# Patient Record
Sex: Female | Born: 1965 | Race: White | Hispanic: No | Smoking: Former smoker
Health system: Southern US, Community
[De-identification: ages and names within clinical notes are randomized; demographics above are authoritative.]

## PROBLEM LIST (undated history)

## (undated) DIAGNOSIS — F32A Depression, unspecified: Secondary | ICD-10-CM

## (undated) DIAGNOSIS — I10 Essential (primary) hypertension: Secondary | ICD-10-CM

## (undated) DIAGNOSIS — F419 Anxiety disorder, unspecified: Secondary | ICD-10-CM

## (undated) DIAGNOSIS — F319 Bipolar disorder, unspecified: Secondary | ICD-10-CM

## (undated) DIAGNOSIS — F329 Major depressive disorder, single episode, unspecified: Secondary | ICD-10-CM

## (undated) HISTORY — PX: ABDOMINAL HYSTERECTOMY: SHX81

---

## 2008-10-17 ENCOUNTER — Emergency Department: Payer: Self-pay | Admitting: Emergency Medicine

## 2009-03-08 ENCOUNTER — Ambulatory Visit: Payer: Self-pay | Admitting: Nurse Practitioner

## 2010-05-23 ENCOUNTER — Ambulatory Visit: Payer: Self-pay | Admitting: Unknown Physician Specialty

## 2010-05-27 ENCOUNTER — Ambulatory Visit: Payer: Self-pay | Admitting: Unknown Physician Specialty

## 2010-05-30 LAB — PATHOLOGY REPORT

## 2012-03-06 DIAGNOSIS — E559 Vitamin D deficiency, unspecified: Secondary | ICD-10-CM | POA: Insufficient documentation

## 2012-03-06 DIAGNOSIS — E538 Deficiency of other specified B group vitamins: Secondary | ICD-10-CM | POA: Insufficient documentation

## 2012-06-18 ENCOUNTER — Ambulatory Visit: Payer: Self-pay | Admitting: Gastroenterology

## 2012-07-11 ENCOUNTER — Ambulatory Visit: Payer: Self-pay | Admitting: Gastroenterology

## 2013-04-11 ENCOUNTER — Emergency Department: Payer: Self-pay | Admitting: Emergency Medicine

## 2013-04-11 LAB — BASIC METABOLIC PANEL
Anion Gap: 3 — ABNORMAL LOW (ref 7–16)
BUN: 10 mg/dL (ref 7–18)
Chloride: 106 mmol/L (ref 98–107)
EGFR (African American): >60
Osmolality: 276 (ref 275–301)
Potassium: 4.1 mmol/L (ref 3.5–5.1)
Sodium: 139 mmol/L (ref 136–145)

## 2013-04-11 LAB — CBC WITH DIFFERENTIAL/PLATELET
Basophil #: 0 10*3/uL (ref 0.0–0.1)
HCT: 38.9 % (ref 35.0–47.0)
Lymphocyte %: 31.9 %
MCH: 32.8 pg (ref 26.0–34.0)
MCV: 93 fL (ref 80–100)
Monocyte #: 0.5 x10 3/mm (ref 0.2–0.9)
Monocyte %: 7.4 %
Neutrophil %: 56.7 %
Platelet: 192 10*3/uL (ref 150–440)

## 2013-05-13 DIAGNOSIS — F6381 Intermittent explosive disorder: Secondary | ICD-10-CM | POA: Insufficient documentation

## 2013-05-28 DIAGNOSIS — J341 Cyst and mucocele of nose and nasal sinus: Secondary | ICD-10-CM | POA: Insufficient documentation

## 2013-10-22 DIAGNOSIS — G43909 Migraine, unspecified, not intractable, without status migrainosus: Secondary | ICD-10-CM | POA: Insufficient documentation

## 2014-08-17 ENCOUNTER — Ambulatory Visit: Payer: Self-pay | Admitting: Podiatry

## 2015-04-14 DIAGNOSIS — F331 Major depressive disorder, recurrent, moderate: Secondary | ICD-10-CM | POA: Insufficient documentation

## 2015-09-14 ENCOUNTER — Ambulatory Visit
Admission: EM | Admit: 2015-09-14 | Discharge: 2015-09-14 | Disposition: A | Discharge status: Home / Self Care | Payer: BLUE CROSS/BLUE SHIELD | Attending: Family Medicine | Admitting: Family Medicine

## 2015-09-14 ENCOUNTER — Ambulatory Visit (INDEPENDENT_AMBULATORY_CARE_PROVIDER_SITE_OTHER): Payer: BLUE CROSS/BLUE SHIELD

## 2015-09-14 ENCOUNTER — Encounter: Payer: Self-pay | Admitting: Emergency Medicine

## 2015-09-14 DIAGNOSIS — R071 Chest pain on breathing: Secondary | ICD-10-CM | POA: Diagnosis not present

## 2015-09-14 DIAGNOSIS — R079 Chest pain, unspecified: Secondary | ICD-10-CM | POA: Insufficient documentation

## 2015-09-14 DIAGNOSIS — IMO0001 Reserved for inherently not codable concepts without codable children: Secondary | ICD-10-CM

## 2015-09-14 DIAGNOSIS — J40 Bronchitis, not specified as acute or chronic: Secondary | ICD-10-CM

## 2015-09-14 DIAGNOSIS — M940 Chondrocostal junction syndrome [Tietze]: Secondary | ICD-10-CM

## 2015-09-14 DIAGNOSIS — R030 Elevated blood-pressure reading, without diagnosis of hypertension: Secondary | ICD-10-CM | POA: Diagnosis not present

## 2015-09-14 DIAGNOSIS — Z720 Tobacco use: Secondary | ICD-10-CM | POA: Diagnosis not present

## 2015-09-14 HISTORY — DX: Anxiety disorder, unspecified: F41.9

## 2015-09-14 LAB — CBC WITH DIFFERENTIAL/PLATELET
BASOS ABS: 0 10*3/uL (ref 0–0.1)
Basophils Relative: 1 %
EOS PCT: 3 %
Eosinophils Absolute: 0.2 10*3/uL (ref 0–0.7)
HEMATOCRIT: 44.6 % (ref 35.0–47.0)
Hemoglobin: 15.5 g/dL (ref 12.0–16.0)
LYMPHS ABS: 2 10*3/uL (ref 1.0–3.6)
LYMPHS PCT: 24 %
MCH: 32.4 pg (ref 26.0–34.0)
MCHC: 34.7 g/dL (ref 32.0–36.0)
MCV: 93.2 fL (ref 80.0–100.0)
Monocytes Absolute: 0.5 10*3/uL (ref 0.2–0.9)
Monocytes Relative: 6 %
NEUTROS ABS: 5.6 10*3/uL (ref 1.4–6.5)
Neutrophils Relative %: 66 %
PLATELETS: 233 10*3/uL (ref 150–440)
RBC: 4.78 MIL/uL (ref 3.80–5.20)
RDW: 12.4 % (ref 11.5–14.5)
WBC: 8.4 10*3/uL (ref 3.6–11.0)

## 2015-09-14 LAB — BASIC METABOLIC PANEL
ANION GAP: 6 (ref 5–15)
BUN: 5 mg/dL — AB (ref 6–20)
CHLORIDE: 103 mmol/L (ref 101–111)
CO2: 28 mmol/L (ref 22–32)
Calcium: 9.2 mg/dL (ref 8.9–10.3)
Creatinine, Ser: 0.73 mg/dL (ref 0.44–1.00)
GFR calc Af Amer: >60 mL/min (ref >60)
GFR calc non Af Amer: >60 mL/min (ref >60)
GLUCOSE: 103 mg/dL — AB (ref 65–99)
POTASSIUM: 3.8 mmol/L (ref 3.5–5.1)
Sodium: 137 mmol/L (ref 135–145)

## 2015-09-14 LAB — TROPONIN I: Troponin I: <0.03 ng/mL (ref <0.031)

## 2015-09-14 LAB — CK: Total CK: 24 U/L — ABNORMAL LOW (ref 38–234)

## 2015-09-14 LAB — CKMB (ARMC ONLY): CK, MB: 0.6 ng/mL (ref 0.5–5.0)

## 2015-09-14 MED ORDER — HYDROCOD POLST-CPM POLST ER 10-8 MG/5ML PO SUER: 5.0000 mL | Freq: Two times a day (BID) | ORAL | Status: DC | PRN

## 2015-09-14 MED ORDER — AZITHROMYCIN 500 MG PO TABS: ORAL_TABLET | ORAL | Status: DC

## 2015-09-14 MED ORDER — ALBUTEROL SULFATE HFA 108 (90 BASE) MCG/ACT IN AERS: 2.0000 | INHALATION_SPRAY | Freq: Four times a day (QID) | RESPIRATORY_TRACT | Status: AC | PRN

## 2015-09-14 MED ORDER — KETOROLAC TROMETHAMINE 60 MG/2ML IM SOLN
60.0000 mg | Freq: Once | INTRAMUSCULAR | Status: AC
  Administered 2015-09-14: 60 mg via INTRAMUSCULAR

## 2015-09-14 NOTE — ED Notes (Signed)
Patient c/o chest pain for the past 3 days.  Patient also reports cough and chest congestion for the past 3 days also.  Patient reports nausea.  Patient denies fevers.

## 2015-09-14 NOTE — ED Provider Notes (Signed)
CSN: [id number]     Arrival date & time 09/14/15  [id number] History   First MD Initiated Contact with Patient 09/14/15 1042    Nurses notes were reviewed. Chief Complaint  Patient presents with  . Chest Pain  . Cough   Patient reports having cough and congestion since before Christmas. She states that Saturday she started having chest pain as well as cough and congestion. States cough congestion is gotten so bad she can't smoke but they were staying in his chest pain that she states is gotten progressively worse and feels a smothering sensation over her chest. States when she takes deep breath it hurts when she takes any movement she hurts in for cough she hurts. She reports being feeling miserable. She's had bronchitis before but never had chest pain before with it.  She does smoke. She states she's had Crestor tested but not recently so she does not know what the results of her cholesterol test are. She does state the pain radiates to her back. And the pain has been continuous for the last 4 days was getting worse.  (Consider location/radiation/quality/duration/timing/severity/associated sxs/prior Treatment) Patient is a 50 y.o. female presenting with chest pain and cough. The history is provided by the patient. No language interpreter was used.  Chest Pain Pain location:  Substernal area and L chest Pain quality: crushing, sharp, shooting, stabbing and throbbing   Pain radiates to:  Mid back Pain radiates to the back: yes   Pain severity:  Severe Onset quality:  Sudden Progression:  Worsening Chronicity:  New Context: breathing, movement, at rest and stress   Relieved by:  Nothing Worsened by:  Coughing, deep breathing, exertion, movement, smoking and certain positions Ineffective treatments:  None tried Associated symptoms: back pain, cough and shortness of breath   Risk factors comment:  Elevated blood pressure Cough Associated symptoms: chest pain and shortness of breath     Past  Medical History  Diagnosis Date  . Anxiety    Past Surgical History  Procedure Laterality Date  . Abdominal hysterectomy     History reviewed. No pertinent family history. Social History  Substance Use Topics  . Smoking status: Current Every Day Smoker -- 1.00 packs/day    Types: Cigarettes  . Smokeless tobacco: None  . Alcohol Use: Yes   OB History    No data available     Review of Systems  Respiratory: Positive for cough and shortness of breath.   Cardiovascular: Positive for chest pain.  Musculoskeletal: Positive for back pain.  All other systems reviewed and are negative.   Allergies  Sulfa antibiotics and Codeine  Home Medications   Prior to Admission medications   Medication Sig Start Date End Date Taking? Authorizing Provider  ALPRAZolam Prudy Feeler(XANAX) 1 MG tablet Take 1 mg by mouth 3 (three) times daily as needed for anxiety.   Yes Historical Provider, MD  QUEtiapine (SEROQUEL) 50 MG tablet Take 50 mg by mouth 3 (three) times daily.   Yes Historical Provider, MD  albuterol (PROVENTIL HFA;VENTOLIN HFA) 108 (90 Base) MCG/ACT inhaler Inhale 2 puffs into the lungs every 6 (six) hours as needed for wheezing or shortness of breath. 09/14/15   Hassan RowanEugene Aariv Medlock, MD  azithromycin (ZITHROMAX) 500 MG tablet 1 tablet daily for 5 days 09/14/15   Hassan RowanEugene Addis Bennie, MD  chlorpheniramine-HYDROcodone [facility](TUSSIONEX PENNKINETIC ER) 10-8 MG/5ML SUER Take 5 mLs by mouth every 12 (twelve) hours as needed for cough. 09/14/15   Hassan RowanEugene Kyshaun Barnette, MD   Meds Ordered and Administered  this Visit   Medications  ketorolac (TORADOL) injection 60 mg (60 mg Intramuscular Given 09/14/15 1139)    BP 173/99 mmHg  Pulse 51  Temp(Src) 97.8 F (36.6 C) (Oral)  Resp 16  Ht 5\' 2"  (1.575 m)  Wt 135 lb (61.236 kg)  BMI 24.69 kg/m2  SpO2 100% No data found.   Physical Exam  Constitutional: She is oriented to person, place, and time. She appears well-developed and well-nourished.  HENT:  Head: Normocephalic and atraumatic.    Right Ear: External ear normal.  Left Ear: External ear normal.  Eyes: Conjunctivae are normal. Pupils are equal, round, and reactive to light.  Neck: Normal range of motion. Neck supple. No thyromegaly present.  Cardiovascular: Normal rate, regular rhythm and normal heart sounds.   Pulmonary/Chest: Effort normal and breath sounds normal. No respiratory distress. She exhibits tenderness and bony tenderness.    Abdominal: Soft.  Musculoskeletal: Normal range of motion.  Lymphadenopathy:    She has cervical adenopathy.  Neurological: She is oriented to person, place, and time. No cranial nerve deficit.  Skin: Skin is warm and dry.  Psychiatric: She has a normal mood and affect.  Vitals reviewed.   ED Course  Procedures (including critical care time)  Labs Review Labs Reviewed  BASIC METABOLIC PANEL - Abnormal; Notable for the following:    Glucose, Bld 103 (*)    BUN 5 (*)    All other components within normal limits  CK - Abnormal; Notable for the following:    Total CK 24 (*)    All other components within normal limits  CBC WITH DIFFERENTIAL/PLATELET  TROPONIN I  [facility]MB([facility] ONLY)    Imaging Review Dg Chest 2 View  09/14/2015  CLINICAL DATA:  50 year old female with pain radiating in the left chest to the mid back for 3 days. Chest cold since mid December. Shortness of breath. Smoker. Initial encounter. EXAM: CHEST  2 VIEW COMPARISON:  None. FINDINGS: Lung volumes at the upper limits of normal. Normal cardiac size and mediastinal contours. Visualized tracheal air column is within normal limits. No pneumothorax, pulmonary edema, pleural effusion or consolidation. No pulmonary nodule or confluent opacity identified. Mild dextro convex thoracic scoliosis. No acute osseous abnormality identified. IMPRESSION: No acute cardiopulmonary abnormality. Electronically Signed   By: Odessa Fleming M.D.   On: 09/14/2015 11:14     Visual Acuity Review  Right Eye Distance:   Left Eye Distance:    Bilateral Distance:    Right Eye Near:   Left Eye Near:    Bilateral Near:     Results for orders placed or performed during the hospital encounter of 09/14/15  CBC with Differential  Result Value Ref Range   WBC 8.4 3.6 - 11.0 K/uL   RBC 4.78 3.80 - 5.20 MIL/uL   Hemoglobin 15.5 12.0 - 16.0 g/dL   HCT 16.1 09.6 - 04.5 %   MCV 93.2 80.0 - 100.0 fL   MCH 32.4 26.0 - 34.0 pg   MCHC 34.7 32.0 - 36.0 g/dL   RDW 40.9 81.1 - 91.4 %   Platelets 233 150 - 440 K/uL   Neutrophils Relative % 66 %   Neutro Abs 5.6 1.4 - 6.5 K/uL   Lymphocytes Relative 24 %   Lymphs Abs 2.0 1.0 - 3.6 K/uL   Monocytes Relative 6 %   Monocytes Absolute 0.5 0.2 - 0.9 K/uL   Eosinophils Relative 3 %   Eosinophils Absolute 0.2 0 - 0.7 K/uL   Basophils Relative  1 %   Basophils Absolute 0.0 0 - 0.1 K/uL  Basic metabolic panel  Result Value Ref Range   Sodium 137 135 - 145 mmol/L   Potassium 3.8 3.5 - 5.1 mmol/L   Chloride 103 101 - 111 mmol/L   CO2 28 22 - 32 mmol/L   Glucose, Bld 103 (H) 65 - 99 mg/dL   BUN 5 (L) 6 - 20 mg/dL   Creatinine, Ser 1.61 0.44 - 1.00 mg/dL   Calcium 9.2 8.9 - 09.6 mg/dL   GFR calc non Af Amer >60 >60 mL/min   GFR calc Af Amer >60 >60 mL/min   Anion gap 6 5 - 15  Troponin I  Result Value Ref Range   Troponin I <0.03 <0.031 ng/mL  CK  Result Value Ref Range   Total CK 24 (L) 38 - 234 U/L  CKMB([facility] only)  Result Value Ref Range   CK, MB 0.6 0.5 - 5.0 ng/mL     MDM   1. Chest pain on breathing   2. Costochondritis, acute   3. Bronchitis   4. Tobacco abuse   5. Elevated BP      Patient presentation and history consistent with costochondritis. Lab work will be obtained troponin CK CK-MB just to make sure no cardiac injury is present and since this is been going on for 3 days think one set cardiac enzymes a sufficient to make the evaluation on her. EKG was normal except for the bradycardia but blood pressures elevated. 660 mg Toradol will be given IM to help with  costochondritis and will send her home on Tussionex and Zithromax a 5 days since no signs of pneumonia is seen.  Patient was told that her blood pressure was elevated and that she needs follow-up with PCP to see if she needs blood pressure medication in the near future. Blood pressure was rechecked and found to still be elevated.   ED ECG REPORT I, Kaydee Magel H, the attending physician, personally viewed and interpreted this ECG.   Date: 09/14/2015  EKG Time: 10:40:36  R sinus bradycardia otherwise normal EKG  rate: 58  Rhythm:there are no previous tracings available for comparison, sinus bradycardia  Axis: 42  Intervals:none  ST&T Change: none    Hassan Rowan, MD 09/14/15 1626

## 2015-09-14 NOTE — ED Notes (Signed)
Pt walked to bathroom to urinate 

## 2015-09-14 NOTE — Discharge Instructions (Signed)
Chest Wall Pain Chest wall pain is pain in or around the bones and muscles of your chest. Sometimes, an injury causes this pain. Sometimes, the cause may not be known. This pain may take several weeks or longer to get better. HOME CARE Pay attention to any changes in your symptoms. Take these actions to help with your pain:  Rest as told by your doctor.  Avoid activities that cause pain. Try not to use your chest, belly (abdominal), or side muscles to lift heavy things.  If directed, apply ice to the painful area:  Put ice in a plastic bag.  Place a towel between your skin and the bag.  Leave the ice on for 20 minutes, 2-3 times per day.  Take over-the-counter and prescription medicines only as told by your doctor.  Do not use tobacco products, including cigarettes, chewing tobacco, and e-cigarettes. If you need help quitting, ask your doctor.  Keep all follow-up visits as told by your doctor. This is important. GET HELP IF:  You have a fever.  Your chest pain gets worse.  You have new symptoms. GET HELP RIGHT AWAY IF:  You feel sick to your stomach (nauseous) or you throw up (vomit).  You feel sweaty or light-headed.  You have a cough with phlegm (sputum) or you cough up blood.  You are short of breath.   This information is not intended to replace advice given to you by your health care provider. Make sure you discuss any questions you have with your health care provider.   Document Released: 02/14/2008 Document Revised: 05/19/2015 Document Reviewed: 11/23/2014 Elsevier Interactive Patient Education 2016 [employer]  Costochondritis Costochondritis is a condition in which the tissue (cartilage) that connects your ribs with your breastbone (sternum) becomes irritated. It causes pain in the chest and rib area. It usually goes away on its own over time. HOME CARE  Avoid activities that wear you out.  Do not strain your ribs. Avoid activities that use  your:  Chest.  Belly.  Side muscles.  Put ice on the area for the first 2 days after the pain starts.  Put ice in a plastic bag.  Place a towel between your skin and the bag.  Leave the ice on for 20 minutes, 2-3 times a day.  Only take medicine as told by your doctor. GET HELP IF:  You have redness or puffiness (swelling) in the rib area.  Your pain does not go away with rest or medicine. GET HELP RIGHT AWAY IF:   Your pain gets worse.  You are very uncomfortable.  You have trouble breathing.  You cough up blood.  You start sweating or throwing up (vomiting).  You have a fever or lasting symptoms for more than 2-3 days.  You have a fever and your symptoms suddenly get worse. MAKE SURE YOU:   Understand these instructions.  Will watch your condition.  Will get help right away if you are not doing well or get worse.   This information is not intended to replace advice given to you by your health care provider. Make sure you discuss any questions you have with your health care provider.   Document Released: 02/14/2008 Document Revised: 04/30/2013 Document Reviewed: 04/01/2013 Elsevier Interactive Patient Education 2016 [employer].  Smoking Cessation, Tips for Success If you are ready to quit smoking, congratulations! You have chosen to help yourself be healthier. Cigarettes bring nicotine, tar, carbon monoxide, and other irritants into your body. Your lungs, heart, and  blood vessels will be able to work better without these poisons. There are many different ways to quit smoking. Nicotine gum, nicotine patches, a nicotine inhaler, or nicotine nasal spray can help with physical craving. Hypnosis, support groups, and medicines help break the habit of smoking. WHAT THINGS CAN I DO TO MAKE QUITTING EASIER?  Here are some tips to help you quit for good:  Pick a date when you will quit smoking completely. Tell all of your friends and family about your plan to quit on  that date.  Do not try to slowly cut down on the number of cigarettes you are smoking. Pick a quit date and quit smoking completely starting on that day.  Throw away all cigarettes.   Clean and remove all ashtrays from your home, work, and car.  On a card, write down your reasons for quitting. Carry the card with you and read it when you get the urge to smoke.  Cleanse your body of nicotine. Drink enough water and fluids to keep your urine clear or pale yellow. Do this after quitting to flush the nicotine from your body.  Learn to predict your moods. Do not let a bad situation be your excuse to have a cigarette. Some situations in your life might tempt you into wanting a cigarette.  Never have "just one" cigarette. It leads to wanting another and another. Remind yourself of your decision to quit.  Change habits associated with smoking. If you smoked while driving or when feeling stressed, try other activities to replace smoking. Stand up when drinking your coffee. Brush your teeth after eating. Sit in a different chair when you read the paper. Avoid alcohol while trying to quit, and try to drink fewer caffeinated beverages. Alcohol and caffeine may urge you to smoke.  Avoid foods and drinks that can trigger a desire to smoke, such as sugary or spicy foods and alcohol.  Ask people who smoke not to smoke around you.  Have something planned to do right after eating or having a cup of coffee. For example, plan to take a walk or exercise.  Try a relaxation exercise to calm you down and decrease your stress. Remember, you may be tense and nervous for the first 2 weeks after you quit, but this will pass.  Find new activities to keep your hands busy. Play with a pen, coin, or rubber band. Doodle or draw things on paper.  Brush your teeth right after eating. This will help cut down on the craving for the taste of tobacco after meals. You can also try mouthwash.   Use oral substitutes in place  of cigarettes. Try using lemon drops, carrots, cinnamon sticks, or chewing gum. Keep them handy so they are available when you have the urge to smoke.  When you have the urge to smoke, try deep breathing.  Designate your home as a nonsmoking area.  If you are a heavy smoker, ask your health care provider about a prescription for nicotine chewing gum. It can ease your withdrawal from nicotine.  Reward yourself. Set aside the cigarette money you save and buy yourself something nice.  Look for support from others. Join a support group or smoking cessation program. Ask someone at home or at work to help you with your plan to quit smoking.  Always ask yourself, "Do I need this cigarette or is this just a reflex?" Tell yourself, "Today, I choose not to smoke," or "I do not want to smoke." You are reminding  yourself of your decision to quit.  Do not replace cigarette smoking with electronic cigarettes (commonly called e-cigarettes). The safety of e-cigarettes is unknown, and some may contain harmful chemicals.  If you relapse, do not give up! Plan ahead and think about what you will do the next time you get the urge to smoke. HOW WILL I FEEL WHEN I QUIT SMOKING? You may have symptoms of withdrawal because your body is used to nicotine (the addictive substance in cigarettes). You may crave cigarettes, be irritable, feel very hungry, cough often, get headaches, or have difficulty concentrating. The withdrawal symptoms are only temporary. They are strongest when you first quit but will go away within 10-14 days. When withdrawal symptoms occur, stay in control. Think about your reasons for quitting. Remind yourself that these are signs that your body is healing and getting used to being without cigarettes. Remember that withdrawal symptoms are easier to treat than the major diseases that smoking can cause.  Even after the withdrawal is over, expect periodic urges to smoke. However, these cravings are  generally short lived and will go away whether you smoke or not. Do not smoke! WHAT RESOURCES ARE AVAILABLE TO HELP ME QUIT SMOKING? Your health care provider can direct you to community resources or hospitals for support, which may include:  Group support.  Education.  Hypnosis.  Therapy.   This information is not intended to replace advice given to you by your health care provider. Make sure you discuss any questions you have with your health care provider.   Document Released: 05/26/2004 Document Revised: 09/18/2014 Document Reviewed: 02/13/2013 Elsevier Interactive Patient Education 2016 Elsevier Inc.  Upper Respiratory Infection, Adult Most upper respiratory infections (URIs) are caused by a virus. A URI affects the nose, throat, and upper air passages. The most common type of URI is often called "the common cold." HOME CARE   Take medicines only as told by your doctor.  Gargle warm saltwater or take cough drops to comfort your throat as told by your doctor.  Use a warm mist humidifier or inhale steam from a shower to increase air moisture. This may make it easier to breathe.  Drink enough fluid to keep your pee (urine) clear or pale yellow.  Eat soups and other clear broths.  Have a healthy diet.  Rest as needed.  Go back to work when your fever is gone or your doctor says it is okay.  You may need to stay home longer to avoid giving your URI to others.  You can also wear a face mask and wash your hands often to prevent spread of the virus.  Use your inhaler more if you have asthma.  Do not use any tobacco products, including cigarettes, chewing tobacco, or electronic cigarettes. If you need help quitting, ask your doctor. GET HELP IF:  You are getting worse, not better.  Your symptoms are not helped by medicine.  You have chills.  You are getting more short of breath.  You have brown or red mucus.  You have yellow or brown discharge from your  nose.  You have pain in your face, especially when you bend forward.  You have a fever.  You have puffy (swollen) neck glands.  You have pain while swallowing.  You have white areas in the back of your throat. GET HELP RIGHT AWAY IF:   You have very bad or constant:  Headache.  Ear pain.  Pain in your forehead, behind your eyes, and over your  cheekbones (sinus pain).  Chest pain.  You have long-lasting (chronic) lung disease and any of the following:  Wheezing.  Long-lasting cough.  Coughing up blood.  A change in your usual mucus.  You have a stiff neck.  You have changes in your:  Vision.  Hearing.  Thinking.  Mood. MAKE SURE YOU:   Understand these instructions.  Will watch your condition.  Will get help right away if you are not doing well or get worse.   This information is not intended to replace advice given to you by your health care provider. Make sure you discuss any questions you have with your health care provider.   Document Released: 02/14/2008 Document Revised: 01/12/2015 Document Reviewed: 12/03/2013 Elsevier Interactive Patient Education [employer]

## 2015-09-29 ENCOUNTER — Other Ambulatory Visit: Payer: Self-pay | Admitting: Family Medicine

## 2015-09-29 DIAGNOSIS — E041 Nontoxic single thyroid nodule: Secondary | ICD-10-CM

## 2015-10-05 ENCOUNTER — Ambulatory Visit
Admission: RE | Admit: 2015-10-05 | Discharge: 2015-10-05 | Disposition: A | Discharge status: Home / Self Care | Payer: BLUE CROSS/BLUE SHIELD | Source: Ambulatory Visit | Attending: Family Medicine | Admitting: Family Medicine

## 2015-10-05 DIAGNOSIS — E042 Nontoxic multinodular goiter: Secondary | ICD-10-CM | POA: Insufficient documentation

## 2015-10-05 DIAGNOSIS — E041 Nontoxic single thyroid nodule: Secondary | ICD-10-CM

## 2015-10-20 DIAGNOSIS — I10 Essential (primary) hypertension: Secondary | ICD-10-CM | POA: Insufficient documentation

## 2015-10-20 DIAGNOSIS — E042 Nontoxic multinodular goiter: Secondary | ICD-10-CM | POA: Insufficient documentation

## 2015-11-12 ENCOUNTER — Ambulatory Visit (INDEPENDENT_AMBULATORY_CARE_PROVIDER_SITE_OTHER): Payer: BLUE CROSS/BLUE SHIELD | Admitting: Sports Medicine

## 2015-11-12 ENCOUNTER — Encounter: Payer: Self-pay | Admitting: Sports Medicine

## 2015-11-12 ENCOUNTER — Ambulatory Visit (INDEPENDENT_AMBULATORY_CARE_PROVIDER_SITE_OTHER): Payer: BLUE CROSS/BLUE SHIELD

## 2015-11-12 DIAGNOSIS — M7741 Metatarsalgia, right foot: Secondary | ICD-10-CM

## 2015-11-12 DIAGNOSIS — F419 Anxiety disorder, unspecified: Secondary | ICD-10-CM | POA: Insufficient documentation

## 2015-11-12 DIAGNOSIS — M79673 Pain in unspecified foot: Secondary | ICD-10-CM | POA: Diagnosis not present

## 2015-11-12 DIAGNOSIS — M21619 Bunion of unspecified foot: Secondary | ICD-10-CM

## 2015-11-12 DIAGNOSIS — M722 Plantar fascial fibromatosis: Secondary | ICD-10-CM

## 2015-11-12 DIAGNOSIS — M2040 Other hammer toe(s) (acquired), unspecified foot: Secondary | ICD-10-CM | POA: Diagnosis not present

## 2015-11-12 MED ORDER — TRIAMCINOLONE ACETONIDE 10 MG/ML IJ SUSP: 10.0000 mg | Freq: Once | INTRAMUSCULAR | Status: AC

## 2015-11-12 MED ORDER — DICLOFENAC SODIUM 75 MG PO TBEC: 75.0000 mg | DELAYED_RELEASE_TABLET | Freq: Two times a day (BID) | ORAL | Status: DC

## 2015-11-12 MED ORDER — METHYLPREDNISOLONE 4 MG PO TBPK: ORAL_TABLET | ORAL | Status: DC

## 2015-11-12 NOTE — Patient Instructions (Signed)

## 2015-11-12 NOTE — Progress Notes (Signed)
Patient ID: DEMONI PARMAR, female   DOB: [birth year], 50 y.o.   MRN: [id number] Subjective: Brenda George is a 50 y.o. female patient presents to office with complaint of heel pain on the left and pain at the ball of the foot on right at the bases of all her lesser toes. Patient admits to post static dyskinesia for 3 months in duration. Patient has treated this problem with rest and elevation. Patient reports that this treatment has not worked. Denies any other pedal concerns.  Patient Active Problem List   Diagnosis Date Noted  . Anxiety 11/12/2015  . Essential (primary) hypertension 10/20/2015  . Multinodular goiter 10/20/2015  . Moderate episode of recurrent major depressive disorder ([place]) 04/14/2015  . Headache, migraine 10/22/2013  . Cyst of nasal sinus 05/28/2013  . Cephalalgia 05/21/2013  . 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 B12 deficiency 03/06/2012  . Avitaminosis D 03/06/2012    Current Outpatient Prescriptions on File Prior to Visit  Medication Sig Dispense Refill  . albuterol (PROVENTIL HFA;VENTOLIN HFA) 108 (90 Base) MCG/ACT inhaler Inhale 2 puffs into the lungs every 6 (six) hours as needed for wheezing or shortness of breath. 1 Inhaler 1  . ALPRAZolam (XANAX) 1 MG tablet Take 1 mg by mouth 3 (three) times daily as needed for anxiety.    Marland Kitchen azithromycin (ZITHROMAX) 500 MG tablet 1 tablet daily for 5 days 5 tablet 0  . chlorpheniramine-HYDROcodone (TUSSIONEX PENNKINETIC ER) 10-8 MG/5ML SUER Take 5 mLs by mouth every 12 (twelve) hours as needed for cough. 115 mL 0  . QUEtiapine (SEROQUEL) 50 MG tablet Take 50 mg by mouth 3 (three) times daily.     No current facility-administered medications on file prior to visit.    Allergies  Allergen Reactions  . Sulfa Antibiotics Anaphylaxis and Swelling  . Citalopram Other (See Comments)    HYPER  . Fluoxetine Other (See Comments)    INCREASED GLUCOSE  . Codeine Rash and Hives    Objective: Physical Exam General: The patient  is alert and oriented x3 in no acute distress.  Dermatology: Skin is warm, dry and supple bilateral lower extremities. Nails 1-10 are normal. There is no erythema, edema, no eccymosis, no open lesions present. Integument is otherwise unremarkable.  Vascular: Dorsalis Pedis pulse and Posterior Tibial pulse are 2/4 bilateral. Capillary fill time is immediate to all digits.  Neurological: Grossly intact to light touch with an achilles reflex of +2/5 and a  negative Tinel's sign bilateral. Negative moulders sign on right.   Musculoskeletal: Tenderness to palpation at the medial calcaneal tubercale and through the insertion of the plantar fascia on the left foot. Diffuse tenderness to plantar aspects of 2-4 metatarsals with right>left bunion and hammertoe. No pain with compression bilateral. No pain with tuning fork to calcaneus bilateral. No pain with calf compression bilateral. Ankle and pedal range of motion within normal limits bilateral. Strength 5/5 in all groups bilateral.    Xray, Right & Left foot:  Normal osseous mineralization. Joint spaces preserved. No fracture/dislocation/boney destruction. Calcaneal spur present with mild thickening of plantar fascia on left and significant bunion and hammertoe on right>left. No other soft tissue abnormalities or radiopaque foreign bodies.   Assessment and Plan: Problem List Items Addressed This Visit    None    Visit Diagnoses    Foot pain, unspecified laterality    -  Primary    Relevant Medications    triamcinolone acetonide (KENALOG) 10 MG/ML injection 10 mg    methylPREDNISolone (MEDROL  DOSEPAK) 4 MG TBPK tablet    diclofenac (VOLTAREN) 75 MG EC tablet    Other Relevant Orders    DG Foot 2 Views Left    DG Foot 2 Views Right    Plantar fasciitis of left foot        Relevant Medications    triamcinolone acetonide (KENALOG) 10 MG/ML injection 10 mg    methylPREDNISolone (MEDROL DOSEPAK) 4 MG TBPK tablet    diclofenac (VOLTAREN) 75 MG EC  tablet    Metatarsalgia of right foot        Relevant Medications    methylPREDNISolone (MEDROL DOSEPAK) 4 MG TBPK tablet    diclofenac (VOLTAREN) 75 MG EC tablet    Bunion        R>L    Relevant Medications    methylPREDNISolone (MEDROL DOSEPAK) 4 MG TBPK tablet    diclofenac (VOLTAREN) 75 MG EC tablet    Hammertoe, unspecified laterality        Relevant Medications    methylPREDNISolone (MEDROL DOSEPAK) 4 MG TBPK tablet    diclofenac (VOLTAREN) 75 MG EC tablet       -Complete examination performed. Discussed with patient in detail the condition of plantar fasciitis on left and likely compensation pain with structural bunion and hammertoe on right, how this occurs and general treatment options. Explained both conservative and surgical treatments.  -After oral consent and aseptic prep, injected a mixture containing 1 ml of 2%  plain lidocaine, 1 ml 0.5% plain marcaine, 0.5 ml of kenalog 10 and 0.5 ml of dexamethasone phosphate into left heel. Post-injection care discussed with patient.  -Rx Diclofenac to start after Medrol dose pack is completed -Recommended good supportive shoes and advised use of OTC insert. Explained to patient that if these orthoses work well, we will continue with these. If these do not improve her condition and  pain, we will consider custom molded orthoses. Gave met pad for right if works well will benefit from custom orthoses.  - Explained in detail the use of the fascial brace for left which was dispensed at today's visit. -Explained and dispensed to patient daily stretching exercises. -Recommend patient to ice affected area 1-2x daily. -Patient to return to office in 3 weeks for follow up or sooner if problems or questions  Arise.  Landis Martins, DPM

## 2015-12-06 ENCOUNTER — Emergency Department: Payer: BLUE CROSS/BLUE SHIELD

## 2015-12-06 ENCOUNTER — Emergency Department
Admission: EM | Admit: 2015-12-06 | Discharge: 2015-12-06 | Disposition: A | Discharge status: Home / Self Care | Payer: BLUE CROSS/BLUE SHIELD | Attending: Emergency Medicine | Admitting: Emergency Medicine

## 2015-12-06 ENCOUNTER — Encounter: Payer: Self-pay | Admitting: Emergency Medicine

## 2015-12-06 DIAGNOSIS — Z87891 Personal history of nicotine dependence: Secondary | ICD-10-CM | POA: Diagnosis not present

## 2015-12-06 DIAGNOSIS — E042 Nontoxic multinodular goiter: Secondary | ICD-10-CM | POA: Diagnosis not present

## 2015-12-06 DIAGNOSIS — R0789 Other chest pain: Secondary | ICD-10-CM | POA: Diagnosis present

## 2015-12-06 DIAGNOSIS — F329 Major depressive disorder, single episode, unspecified: Secondary | ICD-10-CM | POA: Diagnosis not present

## 2015-12-06 DIAGNOSIS — R110 Nausea: Secondary | ICD-10-CM | POA: Insufficient documentation

## 2015-12-06 DIAGNOSIS — R079 Chest pain, unspecified: Secondary | ICD-10-CM

## 2015-12-06 DIAGNOSIS — I10 Essential (primary) hypertension: Secondary | ICD-10-CM | POA: Insufficient documentation

## 2015-12-06 HISTORY — DX: Essential (primary) hypertension: I10

## 2015-12-06 LAB — TROPONIN I
Troponin I: <0.03 ng/mL (ref <0.031)
Troponin I: <0.03 ng/mL (ref <0.031)

## 2015-12-06 LAB — BASIC METABOLIC PANEL
ANION GAP: 7 (ref 5–15)
BUN: 8 mg/dL (ref 6–20)
CALCIUM: 9.7 mg/dL (ref 8.9–10.3)
CO2: 24 mmol/L (ref 22–32)
CREATININE: 0.73 mg/dL (ref 0.44–1.00)
Chloride: 105 mmol/L (ref 101–111)
GFR calc Af Amer: >60 mL/min (ref >60)
GLUCOSE: 109 mg/dL — AB (ref 65–99)
Potassium: 3.8 mmol/L (ref 3.5–5.1)
Sodium: 136 mmol/L (ref 135–145)

## 2015-12-06 LAB — CBC
HEMATOCRIT: 40.6 % (ref 35.0–47.0)
Hemoglobin: 14.6 g/dL (ref 12.0–16.0)
MCH: 32.7 pg (ref 26.0–34.0)
MCHC: 36.1 g/dL — ABNORMAL HIGH (ref 32.0–36.0)
MCV: 90.6 fL (ref 80.0–100.0)
Platelets: 261 10*3/uL (ref 150–440)
RBC: 4.48 MIL/uL (ref 3.80–5.20)
RDW: 13.1 % (ref 11.5–14.5)
WBC: 7.1 10*3/uL (ref 3.6–11.0)

## 2015-12-06 MED ORDER — PANTOPRAZOLE SODIUM 20 MG PO TBEC: 20.0000 mg | DELAYED_RELEASE_TABLET | Freq: Every day | ORAL | Status: AC

## 2015-12-06 NOTE — Discharge Instructions (Signed)

## 2015-12-06 NOTE — ED Notes (Signed)
Repeat Troponin sent to lab.

## 2015-12-06 NOTE — ED Notes (Signed)
Pt informed to return if any life threatening symptoms occur.  

## 2015-12-06 NOTE — ED Notes (Signed)
Pt has chest pain for several weeks.  Pt saw PMD last week and started new blood pressure med.  Pt saw cardiologist today and has appt for stress test next week.  Pt states pain in chest worse after seeing doctor today and came here for eval.  No sob.  No n/v/d.  No diaphoresis.  Pt alert.

## 2015-12-06 NOTE — ED Notes (Signed)
[facility] for [facility] of recent chest pain.  Says they scheduled a stress test.  When she got home she developed chest pain with nausea.

## 2015-12-07 NOTE — ED Provider Notes (Signed)
[facility] Emergency Department Provider Note  ____________________________________________    I have reviewed the triage vital signs and the nursing notes.   HISTORY  Chief Complaint Chest Pain and Nausea    HPI Brenda George is a 50 y.o. female present with complaints of chest pain and nausea. Patient reports she had her initial visit with a [facility] cardiologist this morning. She did have a stress test scheduled for early next week. She has not been having chest pain but had followed up with cardiologist for elevated blood pressures. When she returned from the cardiologist's office she developed mild chest pressure And mild nausea. She denies shortness of breath. No leg pain or swelling. No pleurisy. Currently feels well  Past Medical History  Diagnosis Date  . Anxiety   . Hypertension     Patient Active Problem List   Diagnosis Date Noted  . Anxiety 11/12/2015  . Essential (primary) hypertension 10/20/2015  . Multinodular goiter 10/20/2015  . Moderate episode of recurrent major depressive disorder (HCC) 04/14/2015  . Headache, migraine 10/22/2013  . Cyst of nasal sinus 05/28/2013  . Cephalalgia 05/21/2013  . 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 B12 deficiency 03/06/2012  . Avitaminosis D 03/06/2012    Past Surgical History  Procedure Laterality Date  . Abdominal hysterectomy      Current Outpatient Rx  Name  Route  Sig  Dispense  Refill  . albuterol (PROVENTIL HFA;VENTOLIN HFA) 108 (90 Base) MCG/ACT inhaler   Inhalation   Inhale 2 puffs into the lungs every 6 (six) hours as needed for wheezing or shortness of breath.   1 Inhaler   1   . ALPRAZolam (XANAX) 1 MG tablet   Oral   Take 1 mg by mouth 3 (three) times daily as needed for anxiety.         Marland Kitchen azithromycin (ZITHROMAX) 500 MG tablet      1 tablet daily for 5 days   5 tablet   0   . chlorpheniramine-HYDROcodone (TUSSIONEX PENNKINETIC ER) 10-8 MG/5ML SUER   Oral   Take 5 mLs  by mouth every 12 (twelve) hours as needed for cough.   115 mL   0   . diclofenac (VOLTAREN) 75 MG EC tablet   Oral   Take 1 tablet (75 mg total) by mouth 2 (two) times daily.   30 tablet   0   . methylPREDNISolone (MEDROL DOSEPAK) 4 MG TBPK tablet      Take as instructed   21 tablet   0   . pantoprazole (PROTONIX) 20 MG tablet   Oral   Take 1 tablet (20 mg total) by mouth daily.   30 tablet   1   . QUEtiapine (SEROQUEL) 50 MG tablet   Oral   Take 50 mg by mouth 3 (three) times daily.           Allergies Sulfa antibiotics; Citalopram; Fluoxetine; and Codeine  No family history on file.  Social History Social History  Substance Use Topics  . Smoking status: Former Smoker -- 1.00 packs/day    Types: Cigarettes  . Smokeless tobacco: None  . Alcohol Use: Yes    Review of Systems  Constitutional: Negative for fever. Eyes: Negative for redness ENT: Negative for sore throat Cardiovascular: As above Respiratory: Negative for shortness of breath. Gastrointestinal: Negative for abdominal pain Genitourinary: Negative for dysuria. Musculoskeletal: Negative for back pain. Skin: Negative for rash. Neurological: Negative for focal weakness Psychiatric: no anxiety    ____________________________________________  PHYSICAL EXAM:  VITAL SIGNS: ED Triage Vitals  Enc Vitals Group     BP 12/06/15 1402 161/86 mmHg     Pulse Rate 12/06/15 1402 55     Resp 12/06/15 1402 22     Temp 12/06/15 1402 97.6 F (36.4 C)     Temp Source 12/06/15 1402 Oral     SpO2 12/06/15 1402 100 %     Weight 12/06/15 1402 132 lb (59.875 kg)     Height 12/06/15 1402 5\' 2"  (1.575 m)     Head Cir --      Peak Flow --      Pain Score 12/06/15 1358 6     Pain Loc --      Pain Edu? --      Excl. in GC? --      Constitutional: Alert and oriented. Well appearing and in no distress.  Eyes: Conjunctivae are normal. No erythema or injection ENT   Head: Normocephalic and  atraumatic.   Mouth/Throat: Mucous membranes are moist. Cardiovascular: Normal rate, regular rhythm. Normal and symmetric distal pulses are present in the upper extremities.  Respiratory: Normal respiratory effort without tachypnea nor retractions. Breath sounds are clear and equal bilaterally.  Gastrointestinal: Soft and non-tender in all quadrants. No distention. There is no CVA tenderness. Genitourinary: deferred Musculoskeletal: Nontender with normal range of motion in all extremities. No lower extremity tenderness nor edema. Neurologic:  Normal speech and language. No gross focal neurologic deficits are appreciated. Skin:  Skin is warm, dry and intact. No rash noted. Psychiatric: Mood and affect are normal. Patient exhibits appropriate insight and judgment.  ____________________________________________    LABS (pertinent positives/negatives)  Labs Reviewed  BASIC METABOLIC PANEL - Abnormal; Notable for the following:    Glucose, Bld 109 (*)    All other components within normal limits  CBC - Abnormal; Notable for the following:    MCHC 36.1 (*)    All other components within normal limits  TROPONIN I  TROPONIN I    ____________________________________________   EKG  ED ECG REPORT I, Jene EveryKINNER, Alcus Bradly, the attending physician, personally viewed and interpreted this ECG.  Date: 12/07/2015  Rhythm: normal sinus rhythm QRS Axis: normal Intervals: normal ST/T Wave abnormalities: normal Conduction Disturbances: none Narrative Interpretation: unremarkable   ____________________________________________    RADIOLOGY  Chest x-ray unremarkable ____________________________________________   PROCEDURES  Procedure(s) performed: none  Critical Care performed: none  ____________________________________________   INITIAL IMPRESSION / ASSESSMENT AND PLAN / ED COURSE  Pertinent labs & imaging results that were available during my care of the patient were reviewed by  me and considered in my medical decision making (see chart for details).  Patient presents with complaints of chest discomfort which is now resolved. She has a stress test scheduled for early next week. She is currently symptom-free. Delta troponin is normal. History of present illness not consistent with dissection or PE or myocarditis.  I have asked her to follow up with her PCP and cardiologist. Return precautions discussed at length.  ____________________________________________   FINAL CLINICAL IMPRESSION(S) / ED DIAGNOSES  Final diagnoses:  Chest pain, unspecified chest pain type          Jene Everyobert Lashauna Arpin, MD 12/07/15 [phone]

## 2015-12-10 ENCOUNTER — Ambulatory Visit: Payer: BLUE CROSS/BLUE SHIELD | Admitting: Sports Medicine

## 2016-01-19 DIAGNOSIS — F3130 Bipolar disorder, current episode depressed, mild or moderate severity, unspecified: Secondary | ICD-10-CM | POA: Insufficient documentation

## 2016-01-28 ENCOUNTER — Encounter: Payer: Self-pay | Admitting: Sports Medicine

## 2016-01-28 ENCOUNTER — Ambulatory Visit (INDEPENDENT_AMBULATORY_CARE_PROVIDER_SITE_OTHER): Payer: BLUE CROSS/BLUE SHIELD | Admitting: Sports Medicine

## 2016-01-28 DIAGNOSIS — M722 Plantar fascial fibromatosis: Secondary | ICD-10-CM

## 2016-01-28 DIAGNOSIS — Q667 Congenital pes cavus, unspecified foot: Secondary | ICD-10-CM

## 2016-01-28 DIAGNOSIS — M21619 Bunion of unspecified foot: Secondary | ICD-10-CM | POA: Diagnosis not present

## 2016-01-28 DIAGNOSIS — M7741 Metatarsalgia, right foot: Secondary | ICD-10-CM | POA: Diagnosis not present

## 2016-01-28 DIAGNOSIS — M79673 Pain in unspecified foot: Secondary | ICD-10-CM | POA: Diagnosis not present

## 2016-01-28 DIAGNOSIS — M2040 Other hammer toe(s) (acquired), unspecified foot: Secondary | ICD-10-CM | POA: Diagnosis not present

## 2016-01-28 MED ORDER — DICLOFENAC SODIUM 75 MG PO TBEC: 75.0000 mg | DELAYED_RELEASE_TABLET | Freq: Two times a day (BID) | ORAL | Status: AC

## 2016-01-28 NOTE — Progress Notes (Signed)
Patient ID: Brenda George, female   DOB: 10-29-1965, 50 y.o.   MRN: [id number] Subjective: Brenda SpeckingSelena L George is a 50 y.o. female returns to office for follow up evaluation after Left heel injection for plantar fasciitis, injection #1 administered >2 months ago. Patient states that the injection seems to help her pain but then started to come back and she did not follow up or keep doing everything as instructed. Patient denies any recent changes in medications or new problems since last visit.   Patient Active Problem List   Diagnosis Date Noted  . Bipolar I disorder, most recent episode depressed (HCC) 01/19/2016  . Anxiety 11/12/2015  . Essential (primary) hypertension 10/20/2015  . Multinodular goiter 10/20/2015  . Moderate episode of recurrent major depressive disorder (HCC) 04/14/2015  . Headache, migraine 10/22/2013  . Cyst of nasal sinus 05/28/2013  . Cephalalgia 05/21/2013  . 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 B12 deficiency 03/06/2012  . Avitaminosis D 03/06/2012    Current Outpatient Prescriptions on File Prior to Visit  Medication Sig Dispense Refill  . albuterol (PROVENTIL HFA;VENTOLIN HFA) 108 (90 Base) MCG/ACT inhaler Inhale 2 puffs into the lungs every 6 (six) hours as needed for wheezing or shortness of breath. 1 Inhaler 1  . ALPRAZolam (XANAX) 1 MG tablet Take 1 mg by mouth 3 (three) times daily as needed for anxiety.    Marland Kitchen. azithromycin (ZITHROMAX) 500 MG tablet 1 tablet daily for 5 days 5 tablet 0  . chlorpheniramine-HYDROcodone (TUSSIONEX PENNKINETIC ER) 10-8 MG/5ML SUER Take 5 mLs by mouth every 12 (twelve) hours as needed for cough. 115 mL 0  . methylPREDNISolone (MEDROL DOSEPAK) 4 MG TBPK tablet Take as instructed 21 tablet 0  . pantoprazole (PROTONIX) 20 MG tablet Take 1 tablet (20 mg total) by mouth daily. 30 tablet 1  . QUEtiapine (SEROQUEL) 50 MG tablet Take 50 mg by mouth 3 (three) times daily.     Current Facility-Administered Medications on File Prior to Visit   Medication Dose Route Frequency Provider Last Rate Last Dose  . triamcinolone acetonide (KENALOG) 10 MG/ML injection 10 mg  10 mg Other Once Asencion Islamitorya Derian Dimalanta, DPM        Allergies  Allergen Reactions  . Sulfa Antibiotics Anaphylaxis and Swelling  . Citalopram Other (See Comments)    HYPER  . Fluoxetine Other (See Comments)    INCREASED GLUCOSE  . Codeine Rash and Hives    Objective:   General:  Alert and oriented x 3, in no acute distress  Dermatology: Skin is warm, dry, and supple bilateral. Nails are within normal limits. There is no lower extremity erythema, no eccymosis, no open lesions present bilateral.   Vascular: Dorsalis Pedis and Posterior Tibial pedal pulses are 2/4 bilateral. + hair growth noted bilateral. Capillary Fill Time is 3 seconds in all digits. No varicosities, No edema bilateral lower extremities.   Neurological: Sensation grossly intact to light touch with an achilles reflex of +2 and a  negative Tinel's and Mouldre's sign bilateral. Vibratory, sharp/dull, Semmes Weinstein Monofilament within normal limits.   Musculoskeletal: No tenderness to metatarsals on today's exam. There is tenderness to palpation at the medial calcaneal tubercale and through the insertion of the plantar fascia on the Left foot. No pain with compression to calcaneus or application of tuning fork. There is decreased Ankle joint range of motion bilateral. All other jointsrange of motion  within normal limits bilateral. Pes cavus, hammertoe and R>L bunion that is asymptomatic. Strength 5/5 bilateral.   Assessment and  Plan: Problem List Items Addressed This Visit    None    Visit Diagnoses    Plantar fasciitis of left foot    -  Primary    Relevant Medications    diclofenac (VOLTAREN) 75 MG EC tablet    Foot pain, unspecified laterality        Relevant Medications    diclofenac (VOLTAREN) 75 MG EC tablet    Pes cavus, congenital        Metatarsalgia of right foot        Relevant  Medications    diclofenac (VOLTAREN) 75 MG EC tablet    Bunion        R>L    Relevant Medications    diclofenac (VOLTAREN) 75 MG EC tablet    Hammertoe, unspecified laterality        Relevant Medications    diclofenac (VOLTAREN) 75 MG EC tablet       -Complete examination performed.  -Previous x-rays reviewed. -Discussed with patient in detail the condition of plantar fasciitis, how this  occurs related to the foot type of the patient and general treatment options. - Patient opted for another injection #2 today; After oral consent and aseptic prep, injected a mixture containing 0.5 ml of 1%plain lidocaine, 0.5 ml 0.25% plain marcaine, 0.5 ml of kenalog 10 and 0.5 ml of dexmethasone phosphate to left heel at area of most pain/trigger point injection. -Dispensed night splint for left -Refilled Diclofenac  -Continue with stretching, icing, good supportive shoes, inserts daily.  -Discussed long term care and reocurrence; will closely monitor; if fails to improve will consider other treatment modalities.  -Patient to return to office in 1 month for follow up or sooner if problems or questions arise.  Asencion Islam, DPM

## 2016-02-08 ENCOUNTER — Ambulatory Visit: Payer: BLUE CROSS/BLUE SHIELD | Admitting: Sports Medicine

## 2016-02-22 ENCOUNTER — Ambulatory Visit: Payer: BLUE CROSS/BLUE SHIELD | Admitting: Sports Medicine

## 2016-11-22 ENCOUNTER — Encounter: Payer: Self-pay | Admitting: Emergency Medicine

## 2016-11-22 ENCOUNTER — Emergency Department
Admission: EM | Admit: 2016-11-22 | Discharge: 2016-11-22 | Disposition: A | Discharge status: Home / Self Care | Payer: BLUE CROSS/BLUE SHIELD | Attending: Emergency Medicine | Admitting: Emergency Medicine

## 2016-11-22 DIAGNOSIS — F603 Borderline personality disorder: Secondary | ICD-10-CM

## 2016-11-22 DIAGNOSIS — F329 Major depressive disorder, single episode, unspecified: Secondary | ICD-10-CM | POA: Insufficient documentation

## 2016-11-22 DIAGNOSIS — Z87891 Personal history of nicotine dependence: Secondary | ICD-10-CM | POA: Diagnosis not present

## 2016-11-22 DIAGNOSIS — Z79899 Other long term (current) drug therapy: Secondary | ICD-10-CM | POA: Diagnosis not present

## 2016-11-22 DIAGNOSIS — I10 Essential (primary) hypertension: Secondary | ICD-10-CM | POA: Diagnosis not present

## 2016-11-22 DIAGNOSIS — F4310 Post-traumatic stress disorder, unspecified: Secondary | ICD-10-CM

## 2016-11-22 DIAGNOSIS — F319 Bipolar disorder, unspecified: Secondary | ICD-10-CM

## 2016-11-22 DIAGNOSIS — Z046 Encounter for general psychiatric examination, requested by authority: Secondary | ICD-10-CM | POA: Diagnosis present

## 2016-11-22 HISTORY — DX: Bipolar disorder, unspecified: F31.9

## 2016-11-22 HISTORY — DX: Depression, unspecified: F32.A

## 2016-11-22 HISTORY — DX: Major depressive disorder, single episode, unspecified: F32.9

## 2016-11-22 LAB — CBC
HEMATOCRIT: 44.2 % (ref 35.0–47.0)
HEMOGLOBIN: 15.1 g/dL (ref 12.0–16.0)
MCH: 32.1 pg (ref 26.0–34.0)
MCHC: 34.2 g/dL (ref 32.0–36.0)
MCV: 93.6 fL (ref 80.0–100.0)
Platelets: 272 10*3/uL (ref 150–440)
RBC: 4.72 MIL/uL (ref 3.80–5.20)
RDW: 13.2 % (ref 11.5–14.5)
WBC: 8.5 10*3/uL (ref 3.6–11.0)

## 2016-11-22 LAB — COMPREHENSIVE METABOLIC PANEL
ALBUMIN: 4.5 g/dL (ref 3.5–5.0)
ALK PHOS: 83 U/L (ref 38–126)
ALT: 27 U/L (ref 14–54)
AST: 24 U/L (ref 15–41)
Anion gap: 5 (ref 5–15)
BILIRUBIN TOTAL: 0.5 mg/dL (ref 0.3–1.2)
BUN: 6 mg/dL (ref 6–20)
CALCIUM: 9.4 mg/dL (ref 8.9–10.3)
CO2: 29 mmol/L (ref 22–32)
CREATININE: 0.65 mg/dL (ref 0.44–1.00)
Chloride: 104 mmol/L (ref 101–111)
GFR calc Af Amer: >60 mL/min (ref >60)
GFR calc non Af Amer: >60 mL/min (ref >60)
GLUCOSE: 82 mg/dL (ref 65–99)
Potassium: 3.8 mmol/L (ref 3.5–5.1)
SODIUM: 138 mmol/L (ref 135–145)
TOTAL PROTEIN: 7.3 g/dL (ref 6.5–8.1)

## 2016-11-22 LAB — ETHANOL: Alcohol, Ethyl (B): <5 mg/dL (ref <5)

## 2016-11-22 LAB — SALICYLATE LEVEL: Salicylate Lvl: <7 mg/dL (ref 2.8–30.0)

## 2016-11-22 LAB — ACETAMINOPHEN LEVEL

## 2016-11-22 MED ORDER — CLONIDINE HCL 0.1 MG PO TABS: 0.1000 mg | ORAL_TABLET | Freq: Once | ORAL | Status: DC

## 2016-11-22 NOTE — ED Notes (Signed)
BEHAVIORAL HEALTH ROUNDING Patient sleeping: No. Patient alert and oriented: yes Behavior appropriate: Yes.  ; If no, describe:  Nutrition and fluids offered: yes Toileting and hygiene offered: Yes  Sitter present: q15 minute observations and security  monitoring Law enforcement present: Yes  ODS  

## 2016-11-22 NOTE — ED Provider Notes (Addendum)
Time Seen: Approximately 1258  I have reviewed the triage notes  Chief Complaint: Depression   History of Present Illness: [name] is a 51 y.o. female * who has a history of bipolar disease and has not been on any mood stabilizing drugs for an extensive period of time. She states she was prescribed Paxil in the past which she states did not help. Patient states that she's had violent thoughts toward another individual. She otherwise denies any suicidal thoughts or hallucinations. She is requesting help and apparently went to an outpatient psychiatric evaluation referred her here to the emergency department due to her homicidal thoughts. She states that she would like to see a psychiatrist mainly to see which medication she may need to be started. She states she does have a follow-up appointment with a psychiatrist already scheduled for April 2 Patient arrives with dad no physical complaints other than an occasional headache. She was found to be hypertensive and states that she is taking her medication that she has only had her morning dosage todayto be in on twice a day but is only been taking it primarily once a day. Patient states she took a Xanax prior to arrival.  Past Medical History:  Diagnosis Date  . Anxiety   . Bipolar 1 disorder (HCC)   . Depression   . Hypertension     Patient Active Problem List   Diagnosis Date Noted  . Bipolar I disorder, most recent episode depressed ([facility]) 01/19/2016  . Anxiety 11/12/2015  . Essential (primary) hypertension 10/20/2015  . Multinodular goiter 10/20/2015  . Moderate episode of recurrent major depressive disorder ([facility]) 04/14/2015  . Headache, migraine 10/22/2013  . Cyst of nasal sinus 05/28/2013  . Cephalalgia 05/21/2013  . 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 B12 deficiency 03/06/2012  . Avitaminosis D 03/06/2012    Past Surgical History:  Procedure Laterality Date  . ABDOMINAL HYSTERECTOMY      Past Surgical History:   Procedure Laterality Date  . ABDOMINAL HYSTERECTOMY      Current Outpatient Rx  . Order #: [id number] Class: Print  . Order #: [id number] Class: Historical Med  . Order #: [id number] Class: Print  . Order #: [id number] Class: Print  . Order #: [id number] Class: Normal  . Order #: [id number] Class: Normal  . Order #: [id number] Class: Print  . Order #: [id number] Class: Historical Med    Allergies:  Sulfa antibiotics; Citalopram; Fluoxetine; and Codeine  Family History: No family history on file.  Social History: Social History  Substance Use Topics  . Smoking status: Former Smoker    Packs/day: 1.00    Types: Cigarettes  . Smokeless tobacco: Never Used  . Alcohol use Yes     Comment: occas.      Review of Systems:   10 point review of systems was performed and was otherwise negative:  Constitutional: No fever Eyes: No visual disturbances ENT: No sore throat, ear pain Cardiac: No chest pain Respiratory: No shortness of breath, wheezing, or stridor Abdomen: No abdominal pain, no vomiting, No diarrhea Endocrine: No weight loss, No night sweats Extremities: No peripheral edema, cyanosis Skin: No rashes, easy bruising Neurologic: No focal weakness, trouble with speech or swollowing Urologic: No dysuria, Hematuria, or urinary frequency   Physical Exam:  ED Triage Vitals  Enc Vitals Group     BP 11/22/16 1117 (!) 152/103     Pulse Rate 11/22/16 1117 (!) 57     Resp 11/22/16 1117 18     Temp 11/22/16 1117 97.9 F (36.6  C)     Temp Source 11/22/16 1117 Oral     SpO2 11/22/16 1117 100 %     Weight 11/22/16 1118 115 lb (52.2 kg)     Height 11/22/16 1118 5\' 2"  (1.575 m)     Head Circumference --      Peak Flow --      Pain Score --      Pain Loc --      Pain Edu? --      Excl. in GC? --     General: Awake , Alert , and Oriented times 3; GCS 15 Head: Normal cephalic , atraumatic Eyes: Pupils equal , round, reactive to light Nose/Throat: No nasal drainage, patent upper  airway without erythema or exudate.  Neck: Supple, Full range of motion, No anterior adenopathy or palpable thyroid masses Lungs: Clear to ascultation without wheezes , rhonchi, or rales Heart: Regular rate, regular rhythm without murmurs , gallops , or rubs Abdomen: Soft, non tender without rebound, guarding , or rigidity; bowel sounds positive and symmetric in all 4 quadrants. No organomegaly .        Extremities: 2 plus symmetric pulses. No edema, clubbing or cyanosis Neurologic: normal ambulation, Motor symmetric without deficits, sensory intact Skin: warm, dry, no rashes   Labs:   All laboratory work was reviewed including any pertinent negatives or positives listed below:  Labs Reviewed  ACETAMINOPHEN LEVEL - Abnormal; Notable for the following:       Result Value   Acetaminophen (Tylenol), Serum <10 (*)    All other components within normal limits  COMPREHENSIVE METABOLIC PANEL  ETHANOL  SALICYLATE LEVEL  CBC  URINE DRUG SCREEN, QUALITATIVE ([facility] ONLY)  Laboratory work was reviewed and showed no clinically significant abnormalities.     ED Course: * Patient appears to be cleared from a medical standpoint. I did not feel that she required any further testing at this time. The patient has scheduled psychiatric and TTS consultation. She is been cooperative here in emergency department and I felt there was no need to involuntarily commit the patient at this time.     Assessment: Bipolar disease Hypertension Occasional homicidal thoughts      Plan: * Psychiatric consultation  Addendum Patient was seen by psychiatry and felt clear for discharge. The patient has scheduled follow-up and always can seek local outpatient treatment as needed. She was given standard instructions to return here if she develops persistent homicidal thoughts, suicidal thoughts, or any hallucinations.            Jennye Moccasin, MD 11/22/16 1305    Jennye Moccasin, MD 11/22/16  [phone]

## 2016-11-22 NOTE — ED Notes (Signed)
Pt placed in family room with one on one sitter until room arrangements are made. Pt in no distress, no longer tearful.

## 2016-11-22 NOTE — ED Notes (Signed)
NAD noted at this time. Pt given lunch tray at this time. Denies any further needs. Will continue to monitor for further patient needs.

## 2016-11-22 NOTE — ED Notes (Signed)
Pt wants to leave. Said pt in [id number] is making her mad and driving her crazy. Pt given lunch tray and MD informed.

## 2016-11-22 NOTE — Discharge Instructions (Signed)
Please return immediately if condition worsens. Please contact her primary physician or the physician you were given for referral. If you have any specialist physicians involved in her treatment and plan please also contact them. Thank you for using [facility]. ° °

## 2016-11-22 NOTE — ED Notes (Signed)
Lunch tray ordered 

## 2016-11-22 NOTE — ED Triage Notes (Signed)
Pt states she has been treated for depression for years but has been off her depression, and bi polar meds for a while now. States she is "at her breaking point, and feeling rage, doesn't want to hurt anyone but states if I see that girl out, I'm going to hurt her." Pt tearful in triage. States she has lost a lot of weight and can't sleep. No SI at this time.
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
## 2016-11-22 NOTE — ED Notes (Signed)
Pt has silver wedding band, wedding ring and silver necklace with silver cross. Placed in sterile cup and in patients purse per preference.

## 2016-11-22 NOTE — ED Notes (Signed)
Black coat and black purse placed in pt belongings bag, pt sicker on bag.

## 2016-11-22 NOTE — ED Notes (Signed)
Bra, jeans, black sweatshirt, black nike tennis shoes placed in patient belongings bag. Pt placed her cell phone and car keys in her black purse.

## 2017-01-04 IMAGING — CR DG CHEST 2V
2 series · 2 of 2 positions shown · non-contrast
Comparison: None.

CLINICAL DATA: 49-year-old female with pain radiating in the left
chest to the mid back for 3 days. Chest cold since mid [place].
Shortness of breath. Smoker. Initial encounter.

EXAM:
CHEST  2 VIEW

[chest pa]
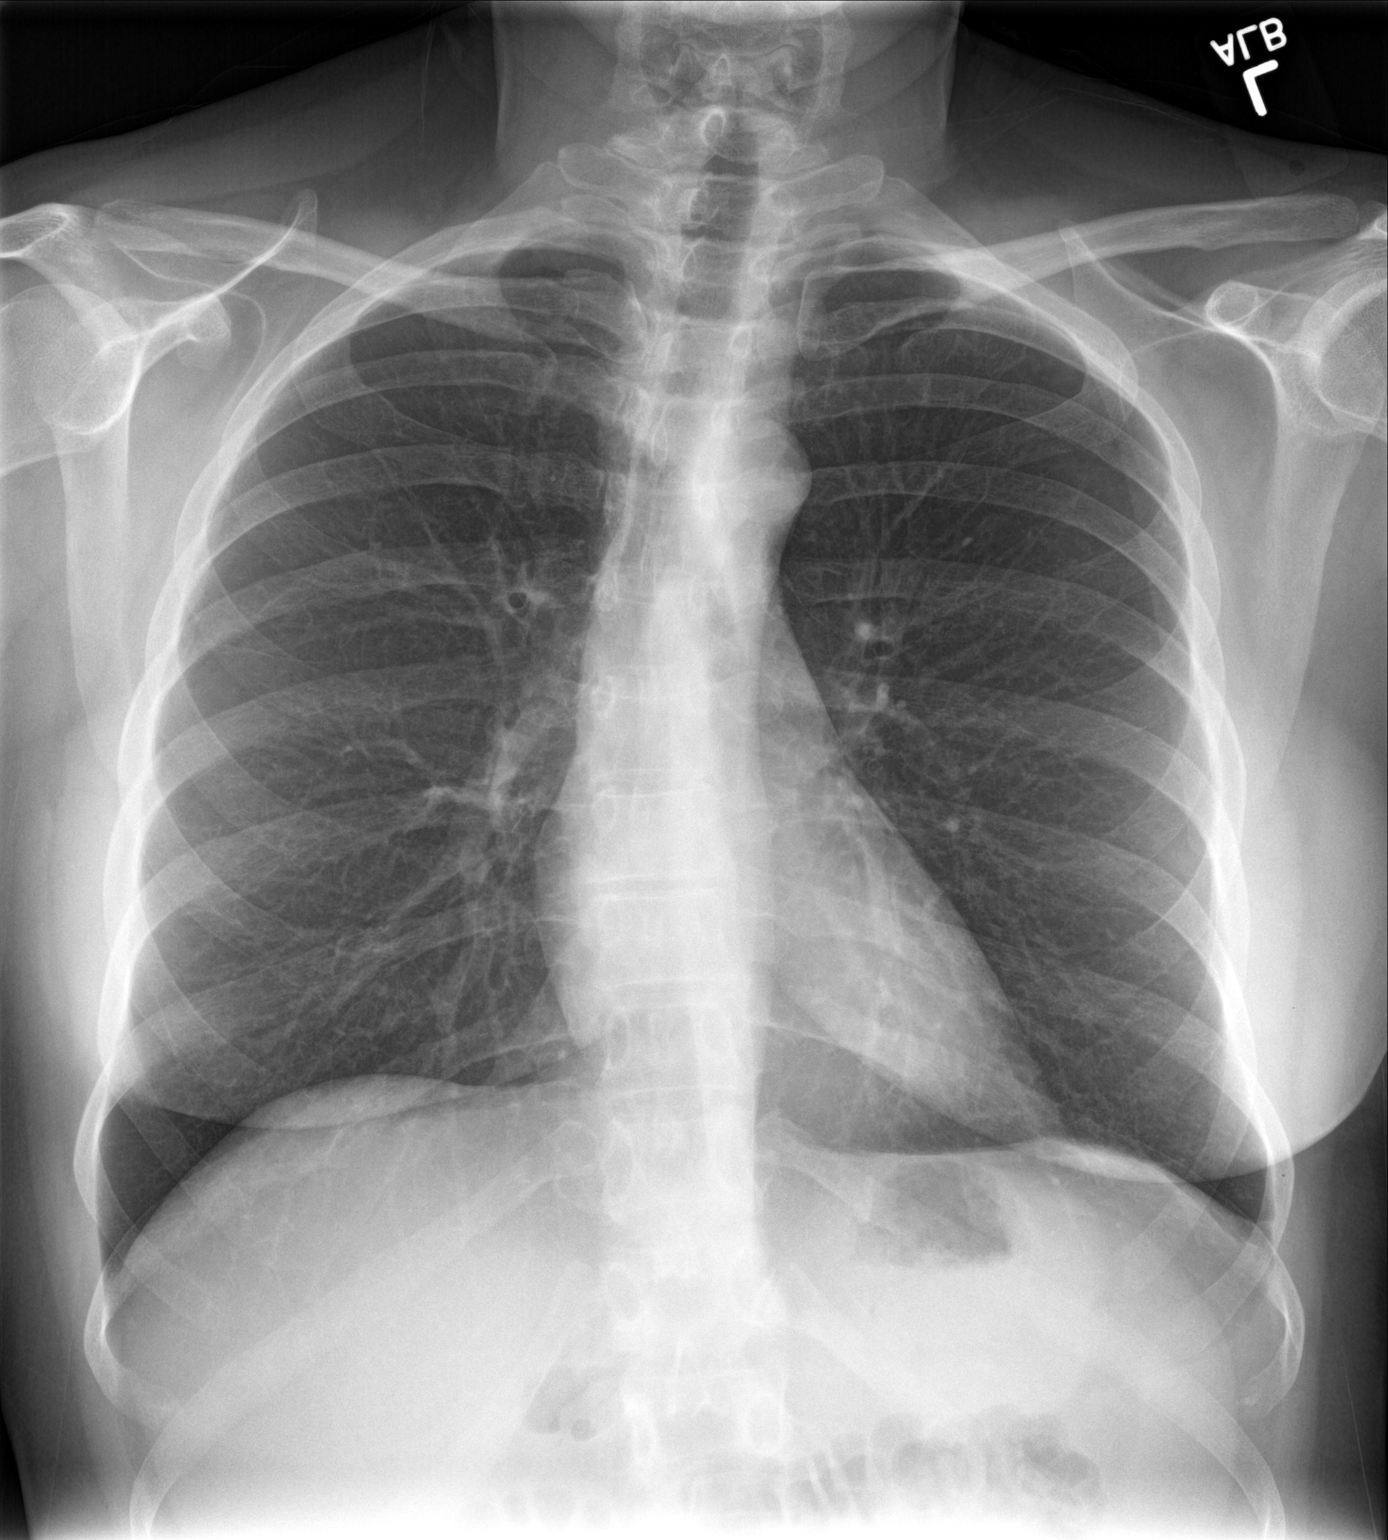

[chest lat]
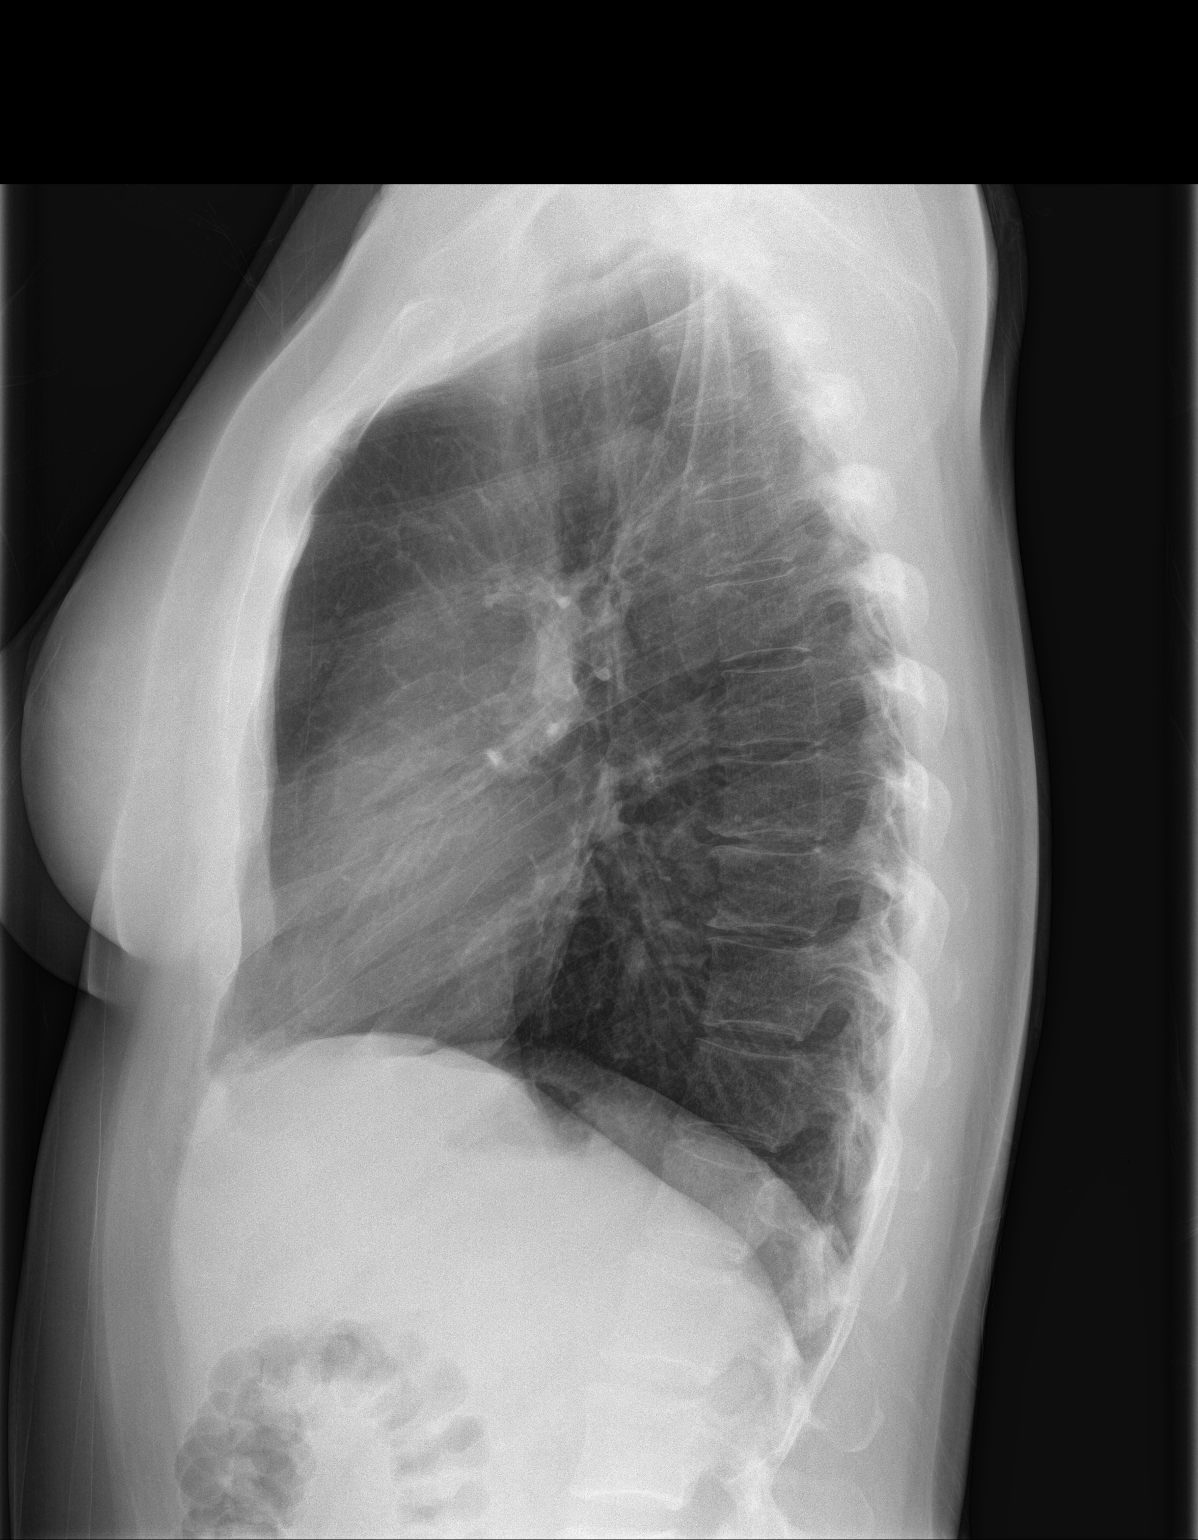

[2 of 2 positions shown; findings below may reference images not displayed]

FINDINGS: Lung volumes at the upper limits of normal. Normal cardiac size and
mediastinal contours. Visualized tracheal air column is within
normal limits. No pneumothorax, pulmonary edema, pleural effusion or
consolidation. No pulmonary nodule or confluent opacity identified.
Mild dextro convex thoracic scoliosis. No acute osseous abnormality
identified.
IMPRESSION: No acute cardiopulmonary abnormality.

## 2017-03-28 IMAGING — CR DG CHEST 2V
2 series · 2 of 2 positions shown · non-contrast
Comparison: 09/14/2015

CLINICAL DATA: Headaches and chest pain x 2 weeks. Changed bp pills
Michaell Bisbal. Blood pressure has been up and down. Severe chest pain
mediastinal. Nausea, burning, weakness. Smoked for 35 years, quit 3
months ago. Shielded.

EXAM:
CHEST - 2 VIEW

[chest pa]
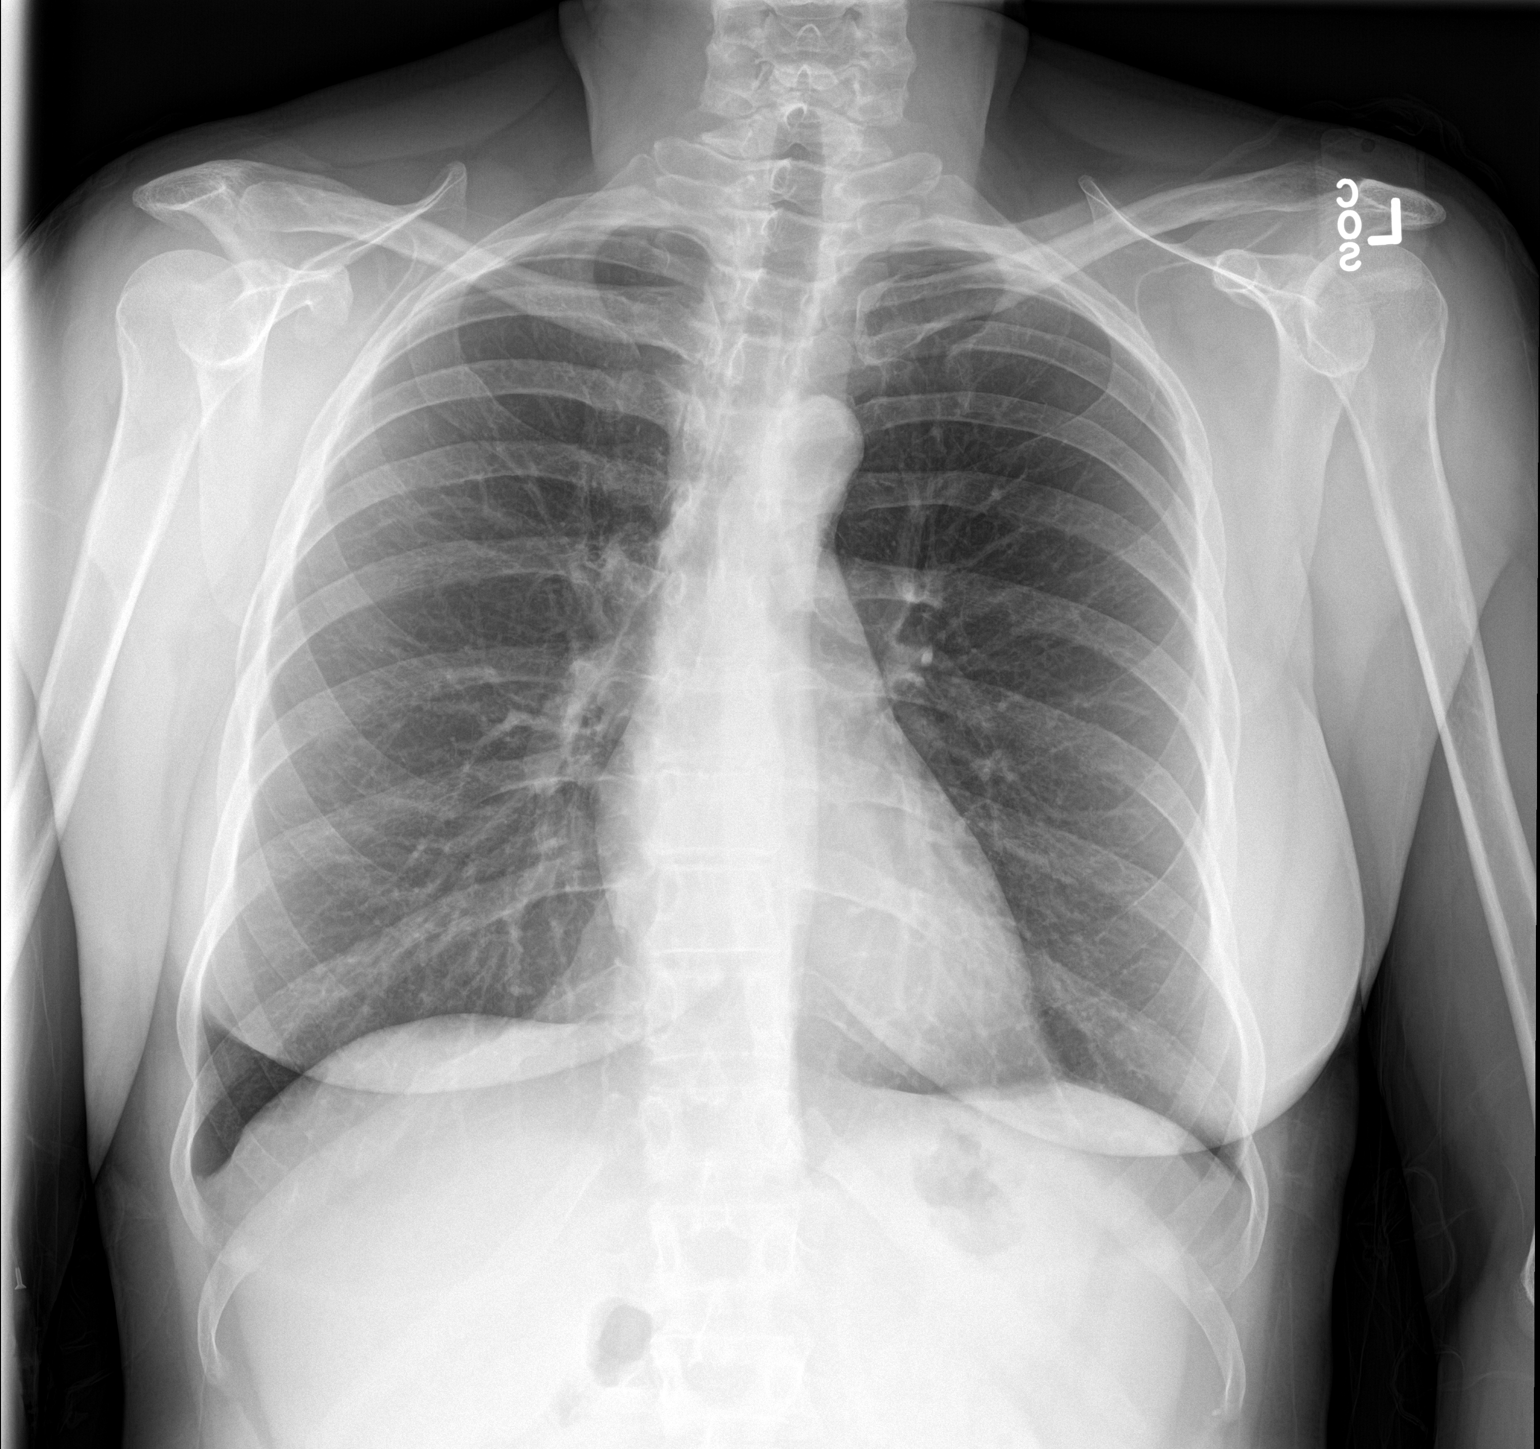

[chest lat]
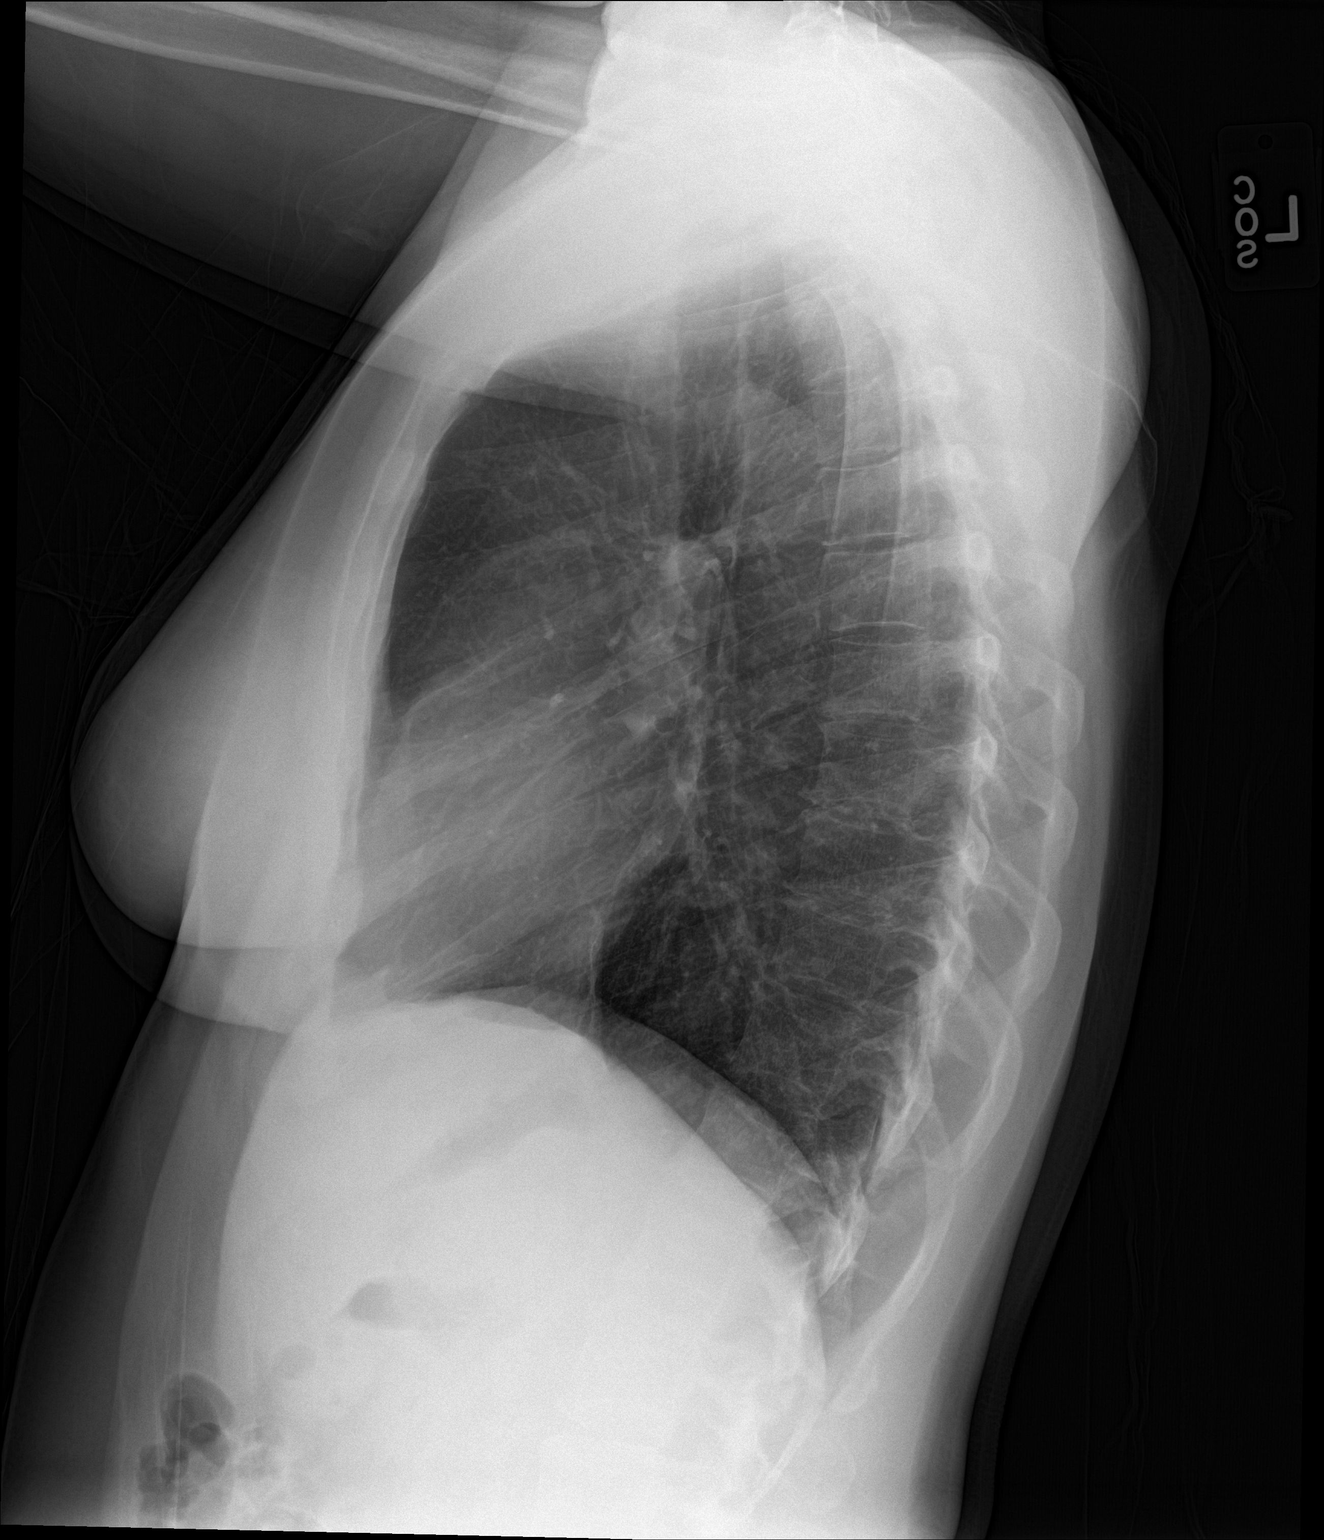

[2 of 2 positions shown; findings below may reference images not displayed]

FINDINGS: Lungs are clear. Heart size and mediastinal contours are within
normal limits.
No effusion.
Visualized skeletal structures are unremarkable.
IMPRESSION: No acute cardiopulmonary disease.

## 2018-06-13 ENCOUNTER — Encounter: Payer: Self-pay | Admitting: Gastroenterology

## 2018-06-13 ENCOUNTER — Ambulatory Visit: Payer: BLUE CROSS/BLUE SHIELD | Admitting: Gastroenterology

## 2018-06-13 DIAGNOSIS — R197 Diarrhea, unspecified: Secondary | ICD-10-CM

## 2019-10-08 ENCOUNTER — Other Ambulatory Visit: Payer: Self-pay | Admitting: Family Medicine

## 2019-10-08 DIAGNOSIS — R599 Enlarged lymph nodes, unspecified: Secondary | ICD-10-CM

## 2019-10-08 DIAGNOSIS — R221 Localized swelling, mass and lump, neck: Secondary | ICD-10-CM

## 2019-10-13 ENCOUNTER — Other Ambulatory Visit: Payer: Self-pay

## 2019-10-13 ENCOUNTER — Encounter (INDEPENDENT_AMBULATORY_CARE_PROVIDER_SITE_OTHER): Payer: Self-pay

## 2019-10-13 ENCOUNTER — Ambulatory Visit
Admission: RE | Admit: 2019-10-13 | Discharge: 2019-10-13 | Disposition: A | Discharge status: Home / Self Care | Payer: Self-pay | Source: Ambulatory Visit | Attending: Family Medicine | Admitting: Family Medicine

## 2019-10-13 ENCOUNTER — Ambulatory Visit: Payer: Self-pay

## 2019-10-13 DIAGNOSIS — R599 Enlarged lymph nodes, unspecified: Secondary | ICD-10-CM

## 2019-10-13 DIAGNOSIS — R221 Localized swelling, mass and lump, neck: Secondary | ICD-10-CM

## 2019-10-17 ENCOUNTER — Telehealth: Payer: Self-pay | Admitting: Emergency Medicine

## 2019-10-17 ENCOUNTER — Other Ambulatory Visit: Payer: Self-pay

## 2019-10-17 ENCOUNTER — Encounter: Payer: Self-pay | Admitting: Emergency Medicine

## 2019-10-17 ENCOUNTER — Ambulatory Visit
Admission: EM | Admit: 2019-10-17 | Discharge: 2019-10-17 | Disposition: A | Discharge status: Home / Self Care | Payer: Self-pay | Attending: Urgent Care | Admitting: Urgent Care

## 2019-10-17 DIAGNOSIS — H6690 Otitis media, unspecified, unspecified ear: Secondary | ICD-10-CM

## 2019-10-17 DIAGNOSIS — H60501 Unspecified acute noninfective otitis externa, right ear: Secondary | ICD-10-CM

## 2019-10-17 MED ORDER — FLUCONAZOLE 150 MG PO TABS: ORAL_TABLET | ORAL | 0 refills | Status: AC

## 2019-10-17 MED ORDER — CEFDINIR 300 MG PO CAPS: 300.0000 mg | ORAL_CAPSULE | Freq: Two times a day (BID) | ORAL | 0 refills | Status: AC

## 2019-10-17 MED ORDER — CIPROFLOXACIN-DEXAMETHASONE 0.3-0.1 % OT SUSP: 4.0000 [drp] | Freq: Two times a day (BID) | OTIC | 0 refills | Status: AC

## 2019-10-17 NOTE — Discharge Instructions (Addendum)
It was very nice seeing you today in clinic. Thank you for entrusting me with your care.   Rest and stay hydrated. Keep ear clean and dry. May use Tylenol and/or Ibuprofen as needed for pain/fever. Sent in prescription for new antibiotic (oral and drops) and yeast infection prevention medications. Call me back if not feeling better.   Make arrangements to follow up with your regular doctor in 1 week for re-evaluation if not improving. If your symptoms/condition worsens, please seek follow up care either here or in the ER. Please remember, our [facility] providers are "right here with you" when you need [place].   Again, it was my pleasure to take care of you today. Thank you for choosing our clinic. I hope that you start to feel better quickly.   Quentin Mulling, MSN, APRN, FNP-C, CEN Advanced Practice Provider [facility]

## 2019-10-17 NOTE — Telephone Encounter (Signed)
Pt called stating that the Ciprodex that was sent in was $300 and she can not afford that. She is requesting something else called in. thanks

## 2019-10-17 NOTE — ED Provider Notes (Signed)
[place], [place]   Name: Brenda George DOB: Mar 29, 1966 MRN: [id number] CSN: [id number] PCP: Rolm Gala, MD  Arrival date and time:  10/17/19 0924  Chief Complaint:  Neck Pain and Otalgia   NOTE: Prior to seeing the patient today, I have reviewed the triage nursing documentation and vital signs. Clinical staff has updated patient's PMH/PSHx, current medication list, and drug allergies/intolerances to ensure comprehensive history available to assist in medical decision making.   History:   HPI: ARICA BEVILACQUA is a 54 y.o. female who presents today with complaints of a 2 weeks history of BILATERAL otalgia. Symptoms started with sore throat, tender LAD, and ear pain. She was seen by her PCP and prescribed a 10 day course of amoxicillin. She notes that her sore throat has resolved, however her lymph nodes and ear continue to be painful. Patient denies cough, congestion, rhinorrhea, and sneezing. She denies forceful nose blowing. Patient has appreciated clear otorrhea from her RIGHT ear. She advises that her ability to hear from the RIGHT ear has acutely changed with the onset of the pain; describes hearing as being muffled. She reports an intense pressure sensation and intermittent tinnitus. Patient denies history of recurrent ear infections. Patient advising that she has not been swimming in the recent past. Patient denies the use of cotton tip swabs to clean her ears. Beyond the prescribed antibiotics, patient has not taken any over the counter interventions to help improve/relieve her reported symptoms at home.   Past Medical History:  Diagnosis Date  . Anxiety   . Bipolar 1 disorder (HCC)   . Depression   . Hypertension     Past Surgical History:  Procedure Laterality Date  . ABDOMINAL HYSTERECTOMY      Family History  Problem Relation Age of Onset  . Heart disease Mother   . Hypertension Mother   . Heart disease Father   . Hypertension Father     Social History   Tobacco Use    . Smoking status: Former Smoker    Packs/day: 1.00    Types: Cigarettes  . Smokeless tobacco: Never Used  Substance Use Topics  . Alcohol use: Yes    Comment: occas.   . Drug use: No    Patient Active Problem List   Diagnosis Date Noted  . Borderline personality disorder (HCC) 11/22/2016  . PTSD (post-traumatic stress disorder) 11/22/2016  . Bipolar I disorder, most recent episode depressed (HCC) 01/19/2016  . Anxiety 11/12/2015  . Essential (primary) hypertension 10/20/2015  . Multinodular goiter 10/20/2015  . Moderate episode of recurrent major depressive disorder (HCC) 04/14/2015  . Headache, migraine 10/22/2013  . Cyst of nasal sinus 05/28/2013  . Cephalalgia 05/21/2013  . 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 B12 deficiency 03/06/2012  . Avitaminosis D 03/06/2012    Home Medications:    Current Meds  Medication Sig  . ALPRAZolam (XANAX) 1 MG tablet Take 1 mg by mouth 3 (three) times daily as needed for anxiety.   Allergies:   Sulfa antibiotics, Citalopram, Fluoxetine, Latuda [lurasidone], and Codeine  Review of Systems (ROS): Review of Systems  Constitutional: Negative for fatigue and fever.  HENT: Positive for ear discharge, ear pain and tinnitus. Negative for congestion, postnasal drip, rhinorrhea, sinus pressure, sinus pain, sneezing and sore throat.   Eyes: Negative for pain, discharge and redness.  Respiratory: Negative for cough, chest tightness and shortness of breath.   Cardiovascular: Negative for chest pain and palpitations.  Gastrointestinal: Negative for abdominal pain, diarrhea, nausea and vomiting.  Musculoskeletal: Negative for arthralgias, back pain, myalgias and neck pain.  Skin: Negative for color change, pallor and rash.  Neurological: Negative for dizziness, syncope, weakness and headaches.  Hematological: Positive for adenopathy.     Vital Signs: Today's Vitals   10/17/19 0939 10/17/19 0942  BP:  132/89  Pulse:  73  Resp:  14  Temp:   98.6 F (37 C)  TempSrc:  Oral  SpO2:  99%  Weight: 116 lb (52.6 kg)   Height: 5\' 2"  (1.575 m)   PainSc: 2      Physical Exam: Physical Exam  Constitutional: She is oriented to person, place, and time and well-developed, well-nourished, and in no distress.  HENT:  Head: Normocephalic and atraumatic.  Right Ear: There is drainage (clear), swelling and tenderness. Tympanic membrane is erythematous and bulging (mild). A middle ear effusion (muppurative) is present.  Left Ear: Tympanic membrane is not erythematous. A middle ear effusion (mild serous) is present.  Eyes: Pupils are equal, round, and reactive to light.  Neck: Thyroid mass (known BILATERAL thyroid nodules; subcentimeter) present.  Reports pain in RIGHT side of neck with lateral rotation due to tender and palpable LAD.   Cardiovascular: Normal rate, regular rhythm, normal heart sounds and intact distal pulses.  Pulmonary/Chest: Effort normal and breath sounds normal.  Musculoskeletal:        General: Normal range of motion.  Lymphadenopathy:       Head (right side): Submandibular adenopathy present.  Neurological: She is alert and oriented to person, place, and time. Gait normal.  Skin: Skin is warm and dry. No rash noted. She is not diaphoretic.  Psychiatric: Mood, memory, affect and judgment normal.  Nursing note and vitals reviewed.   Urgent Care Treatments / Results:   No orders of the defined types were placed in this encounter.   LABS: PLEASE NOTE: all labs that were ordered this encounter are listed, however only abnormal results are displayed. Labs Reviewed - No data to display  EKG: -None  RADIOLOGY: No results found.  PROCEDURES: Procedures  MEDICATIONS RECEIVED THIS VISIT: Medications - No data to display  PERTINENT CLINICAL COURSE NOTES/UPDATES:   Initial Impression / Assessment and Plan / Urgent Care Course:  Pertinent labs & imaging results that were available during my care of the patient  were personally reviewed by me and considered in my medical decision making (see lab/imaging section of note for values and interpretations).  Brenda George is a 54 y.o. female who presents to [facility] today with complaints of Neck Pain and Otalgia  Patient is well appearing overall in clinic today. She does not appear to be in any acute distress. Presenting symptoms (see HPI) and exam as documented above. Exam reveals AOME on the RIGHT. Effusion is suppurative. EAC is tender, erythematous, and the tissue is macerated due to the clear otorrhea present. She is s/p 10 day course of amoxicillin. Concern for continued infection of both the inner and our ear. Will cover with a 10 day course of cefdinir, in addition to a 7 day course of ciprofloxacin/dexamethasone otic gtts. Discussed that the Ciprodex gtts can be expensive and sometimes insurances wont cover it. Patient states, "if they wont, I will pay for it". Discussed that if insurance will not cover, and the cost is too high, she should have pharmacy call us while she is waiting and I will send in something that is more affordable (Cortisporin). Discussed supportive care measures at home. Patient to make efforts to  ensure that ears are being kept clean and dry as possible. She was encouraged to ensure adequate hydration. Patient may use APAP and/or IBU on an as needed basis for pain/fever. Patient has has a history of vulvovaginal candidiasis in the past while on oral antimicrobial therapy. Will send in prophylactic fluconazole dose (150 mg x 1 - may repeat in 72 hours if still symptomatic) for patient to use should she develop symptoms.  Discussed follow up with primary care physician in 1 week for re-evaluation. I have reviewed the follow up and strict return precautions for any new or worsening symptoms. Patient is aware of symptoms that would be deemed urgent/emergent, and would thus require further evaluation either here or in the emergency  department. At the time of discharge, she verbalized understanding and consent with the discharge plan as it was reviewed with her. All questions were fielded by provider and/or clinic staff prior to patient discharge.    Final Clinical Impressions / Urgent Care Diagnoses:   Final diagnoses:  Acute otitis externa of right ear, unspecified type  Acute otitis media, unspecified otitis media type    New Prescriptions:  [place] Controlled Substance Registry consulted? Not Applicable  Meds ordered this encounter  Medications  . cefdinir (OMNICEF) 300 MG capsule    Sig: Take 1 capsule (300 mg total) by mouth 2 (two) times daily for 10 days.    Dispense:  20 capsule    Refill:  0  . fluconazole (DIFLUCAN) 150 MG tablet    Sig: Take 1 tablet (150 mg) PO x 1 dose. May repeat 150 mg dose in 3 days if still symptomatic.    Dispense:  2 tablet    Refill:  0  . ciprofloxacin-dexamethasone (CIPRODEX) OTIC suspension    Sig: Place 4 drops into the right ear 2 (two) times daily.    Dispense:  7.5 mL    Refill:  0    Recommended Follow up Care:  Patient encouraged to follow up with the following provider within the specified time frame, or sooner as dictated by the severity of her symptoms. As always, she was instructed that for any urgent/emergent care needs, she should seek care either here or in the emergency department for more immediate evaluation.  Follow-up Information    Rolm Gala, MD In 1 week.   Specialty: Family Medicine Why: General reassessment of symptoms if not improving Contact information: [address] [place] [address] [phone]         NOTE: This note was prepared using Dragon dictation software along with smaller phrase technology. Despite my best ability to proofread, there is the potential that transcriptional errors may still occur from this process, and are completely unintentional.    Verlee Monte, NP 10/18/19 1616

## 2019-10-17 NOTE — ED Triage Notes (Signed)
Patient was seen by her PCP for neck and ear pain 2 weeks ago.  Patient states that she had an ultrasound on her neck on Monday.  Patient has been Amoxicillin for 10 days.  Patient reports drainage form her right ear for the past couple of days.  Patient c/o ongoing pain in her right ear.

## 2019-10-17 NOTE — Telephone Encounter (Signed)
May send in Cortisporin otic gtts. Sig: 4 gtts TID x 5 days (Disp 10 mL).  Quentin Mulling, MSN, APRN, FNP-C, CEN Advanced Practice Provider [facility] [place] [facility] 10/17/2019 7:04 PM

## 2019-10-17 NOTE — Telephone Encounter (Signed)
Pt had already gotten antibiotic and did not need the new one called in.

## 2021-02-02 IMAGING — US US SOFT TISSUE HEAD/NECK
1 series · 11 of 11 positions shown · non-contrast
Comparison: 10/05/2015

CLINICAL DATA: 53-year-old female with a history of enlarged
thyroid nodules

EXAM:
THYROID ULTRASOUND
SOFT TISSUE ULTRASOUND POSTERIOR NECK
TECHNIQUE: Ultrasound examination of the thyroid gland and adjacent soft
tissues was performed.

[Series 1: us soft tissue head/neck · 0.07mm/px · 11 acquisitions, 11 frames shown]
[im 1/11]
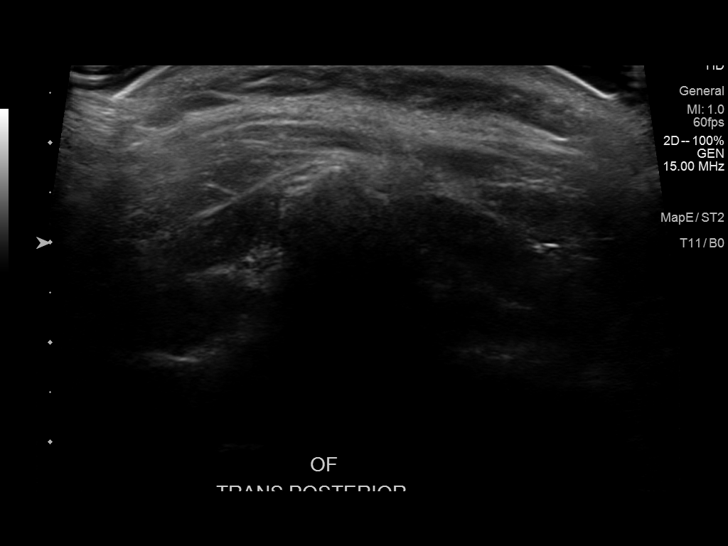
[im 2/11]
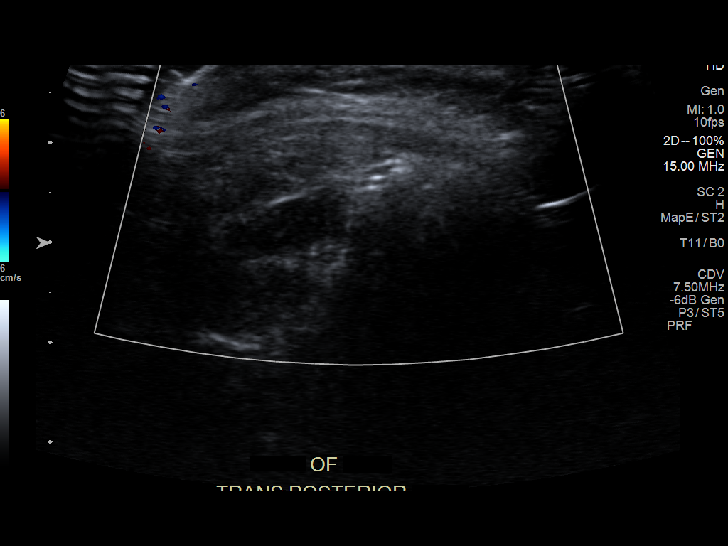
[im 3/11]
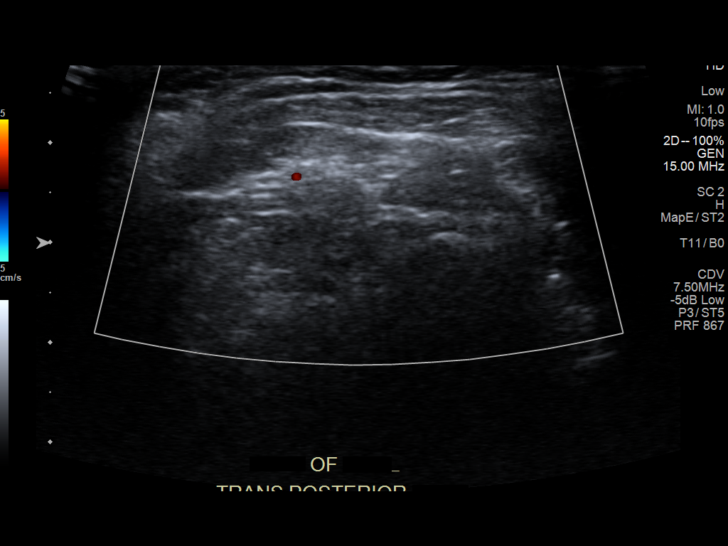
[im 4/11]
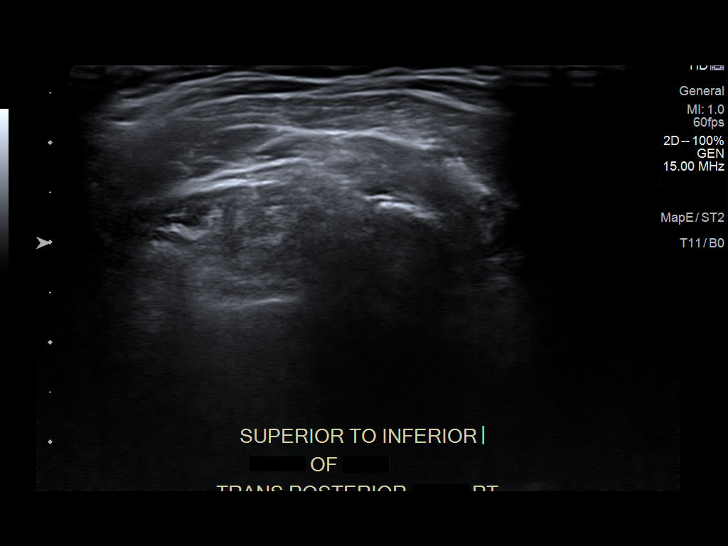
[im 5/11]
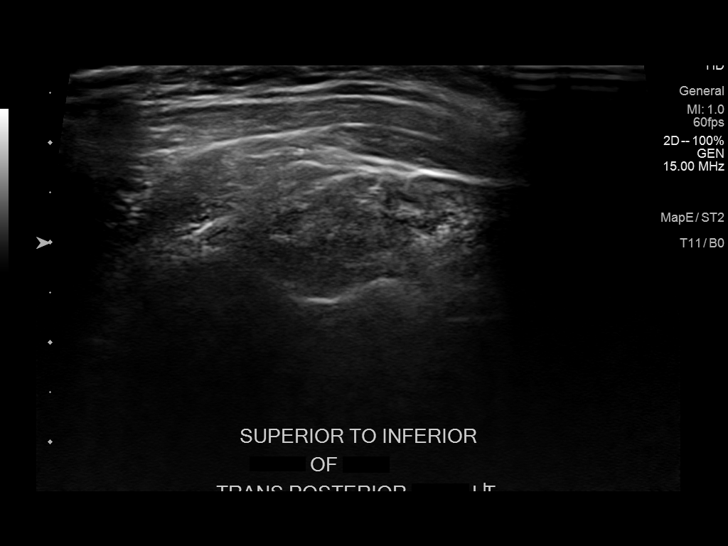
[im 6/11]
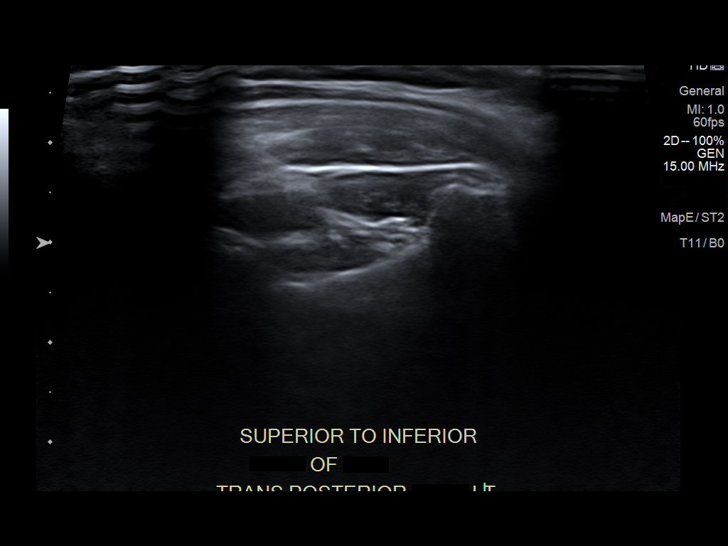
[im 7/11]
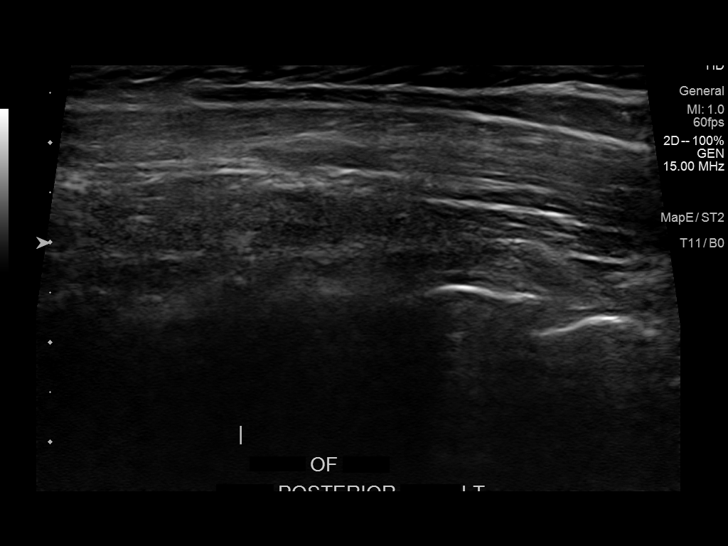
[im 8/11]
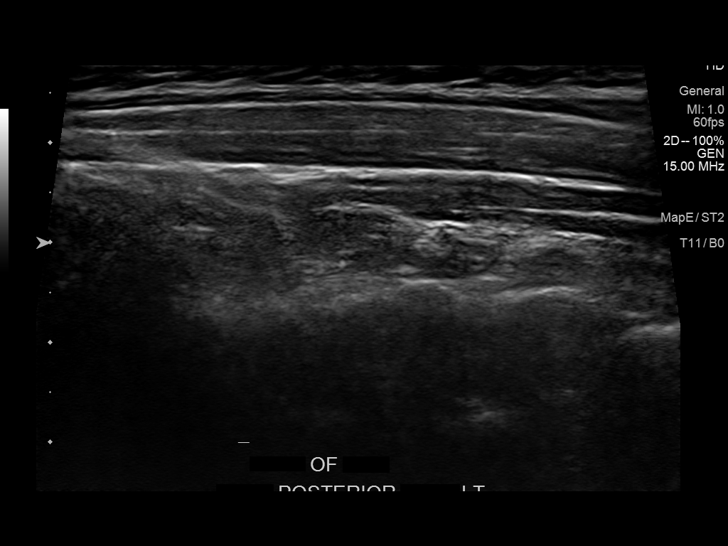
[im 9/11]
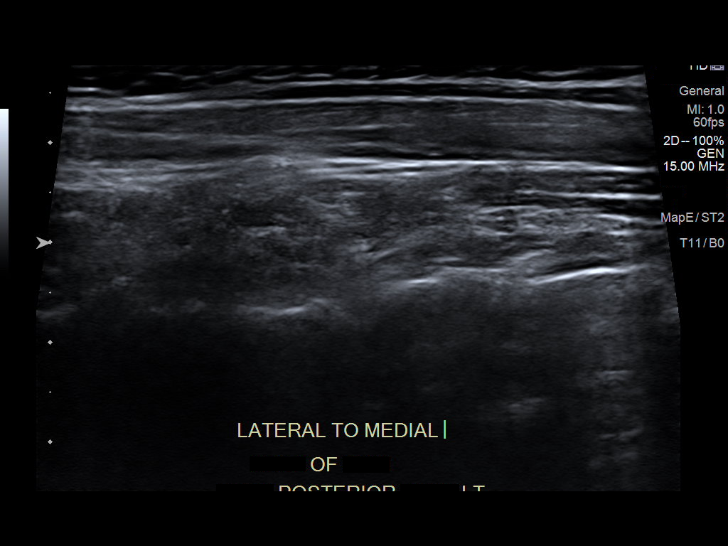
[im 10/11]
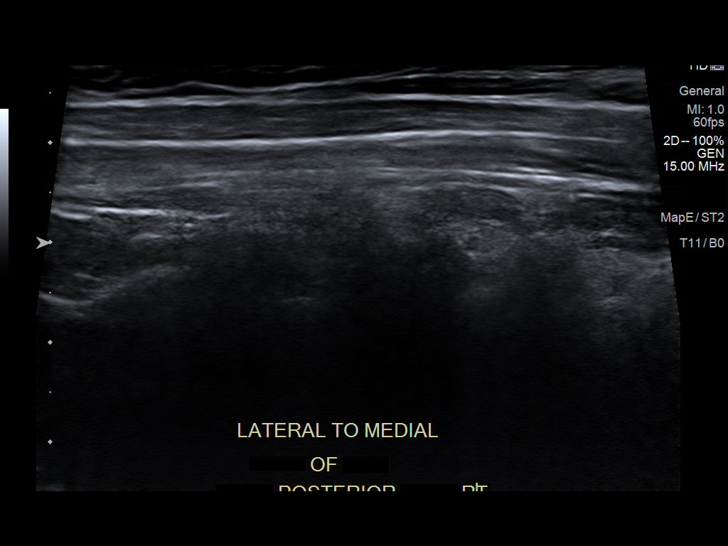
[im 11/11]
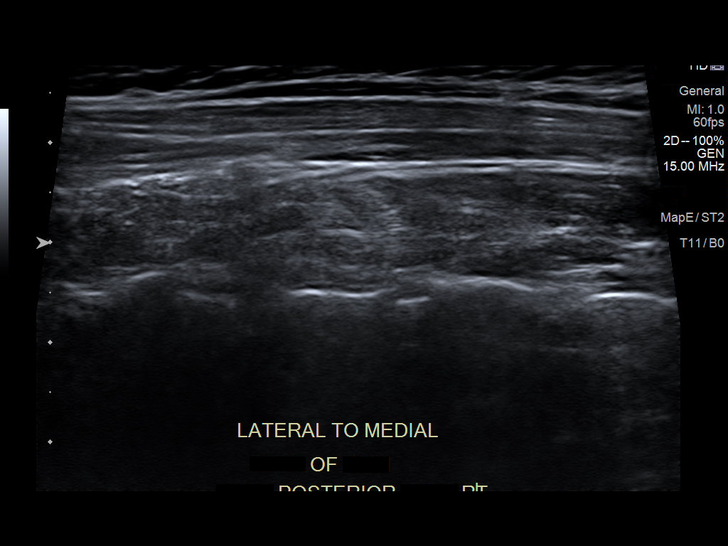

[11 of 11 positions shown; findings below may reference images not displayed]

FINDINGS: Parenchymal Echotexture: Normal

Isthmus: 0.3 cm

Right lobe: 4.4 cm x 1.4 cm x 1.6 cm

Left lobe: 4.1 cm x 1.3 cm x 1.3 cm

_________________________________________________________

Estimated total number of nodules >/= 1 cm: 2

Number of spongiform nodules >/=  2 cm not described below (TR1): 0

Number of mixed cystic and solid nodules >/= 1.5 cm not described
below (TR2): 0

_________________________________________________________

Nodule # 1:

Location: Right; Superior

Maximum size: 1.45 cm; Other 2 dimensions: 0.4 cm x 0.7 cm

Composition: solid/almost completely solid (2)

Echogenicity: hypoechoic (2)

Shape: not taller-than-wide (0)

Margins: smooth (0)

Echogenic foci: none (0)

ACR TI-RADS total points: 4.

ACR TI-RADS risk category: TR4 (4-6 points).

ACR TI-RADS recommendations:

Nodule meets criteria for surveillance

_________________________________________________________

Nodule # 2:

Location: Left; Superior

Maximum size: 0.95 cm; Other 2 dimensions: 0.4 cm x 0.7 cm

Composition: solid/almost completely solid (2)

Echogenicity: hypoechoic (2)

Shape: not taller-than-wide (0)

Margins: smooth (0)

Echogenic foci: none (0)

ACR TI-RADS total points: 4.

ACR TI-RADS risk category: TR4 (4-6 points).

ACR TI-RADS recommendations:

Nodule does not meet criteria for surveillance or biopsy

_________________________________________________________

No adenopathy.

Grayscale and color duplex for also performed in the posterior neck.

No adenopathy.  No focal fluid.  No soft tissue lesion.
IMPRESSION: Right superior thyroid nodule (labeled 1) meets criteria for
surveillance, as designated by the newly established ACR TI-RADS
criteria. Surveillance ultrasound study recommended to be performed
annually up to 5 years.

Above recommendations follow those established by the new ACR
TI-RADS criteria ([HOSPITAL] 4857;[DATE]).

Sonographic survey of the posterior neck unremarkable.

## 2021-12-15 DIAGNOSIS — E041 Nontoxic single thyroid nodule: Secondary | ICD-10-CM | POA: Diagnosis not present

## 2021-12-15 DIAGNOSIS — F411 Generalized anxiety disorder: Secondary | ICD-10-CM | POA: Diagnosis not present

## 2021-12-15 DIAGNOSIS — R079 Chest pain, unspecified: Secondary | ICD-10-CM | POA: Diagnosis not present

## 2021-12-15 DIAGNOSIS — Z599 Problem related to housing and economic circumstances, unspecified: Secondary | ICD-10-CM | POA: Diagnosis not present

## 2021-12-15 DIAGNOSIS — Z131 Encounter for screening for diabetes mellitus: Secondary | ICD-10-CM | POA: Diagnosis not present

## 2022-02-07 DIAGNOSIS — L988 Other specified disorders of the skin and subcutaneous tissue: Secondary | ICD-10-CM | POA: Diagnosis not present

## 2022-02-07 DIAGNOSIS — R2231 Localized swelling, mass and lump, right upper limb: Secondary | ICD-10-CM | POA: Diagnosis not present

## 2022-02-07 DIAGNOSIS — H9201 Otalgia, right ear: Secondary | ICD-10-CM | POA: Diagnosis not present

## 2022-03-07 DIAGNOSIS — N644 Mastodynia: Secondary | ICD-10-CM | POA: Diagnosis not present

## 2022-03-07 DIAGNOSIS — R2231 Localized swelling, mass and lump, right upper limb: Secondary | ICD-10-CM | POA: Diagnosis not present

## 2022-04-11 DIAGNOSIS — F411 Generalized anxiety disorder: Secondary | ICD-10-CM | POA: Diagnosis not present

## 2022-04-11 DIAGNOSIS — I10 Essential (primary) hypertension: Secondary | ICD-10-CM | POA: Diagnosis not present

## 2022-04-11 DIAGNOSIS — G4709 Other insomnia: Secondary | ICD-10-CM | POA: Diagnosis not present

## 2022-04-19 DIAGNOSIS — B3731 Acute candidiasis of vulva and vagina: Secondary | ICD-10-CM | POA: Diagnosis not present

## 2022-04-19 DIAGNOSIS — N898 Other specified noninflammatory disorders of vagina: Secondary | ICD-10-CM | POA: Diagnosis not present

## 2022-07-11 DIAGNOSIS — N951 Menopausal and female climacteric states: Secondary | ICD-10-CM | POA: Diagnosis not present

## 2022-07-11 DIAGNOSIS — Z113 Encounter for screening for infections with a predominantly sexual mode of transmission: Secondary | ICD-10-CM | POA: Diagnosis not present

## 2022-10-26 DIAGNOSIS — R3129 Other microscopic hematuria: Secondary | ICD-10-CM | POA: Diagnosis not present

## 2023-01-04 DIAGNOSIS — H60393 Other infective otitis externa, bilateral: Secondary | ICD-10-CM | POA: Diagnosis not present

## 2023-01-19 DIAGNOSIS — R002 Palpitations: Secondary | ICD-10-CM | POA: Diagnosis not present

## 2023-01-19 DIAGNOSIS — R634 Abnormal weight loss: Secondary | ICD-10-CM | POA: Diagnosis not present

## 2023-01-24 ENCOUNTER — Other Ambulatory Visit: Payer: Self-pay | Admitting: Internal Medicine

## 2023-01-24 DIAGNOSIS — R634 Abnormal weight loss: Secondary | ICD-10-CM | POA: Diagnosis not present

## 2023-01-24 DIAGNOSIS — L309 Dermatitis, unspecified: Secondary | ICD-10-CM | POA: Diagnosis not present

## 2023-01-24 DIAGNOSIS — Z8639 Personal history of other endocrine, nutritional and metabolic disease: Secondary | ICD-10-CM | POA: Diagnosis not present

## 2023-01-24 DIAGNOSIS — E041 Nontoxic single thyroid nodule: Secondary | ICD-10-CM

## 2023-01-31 ENCOUNTER — Ambulatory Visit
Admission: RE | Admit: 2023-01-31 | Discharge: 2023-01-31 | Disposition: A | Discharge status: Home / Self Care | Payer: 59 | Source: Ambulatory Visit | Attending: Internal Medicine | Admitting: Internal Medicine

## 2023-01-31 DIAGNOSIS — R634 Abnormal weight loss: Secondary | ICD-10-CM | POA: Insufficient documentation

## 2023-01-31 DIAGNOSIS — E042 Nontoxic multinodular goiter: Secondary | ICD-10-CM | POA: Diagnosis not present

## 2023-01-31 DIAGNOSIS — E041 Nontoxic single thyroid nodule: Secondary | ICD-10-CM | POA: Insufficient documentation

## 2023-03-27 DIAGNOSIS — D34 Benign neoplasm of thyroid gland: Secondary | ICD-10-CM | POA: Diagnosis not present

## 2023-03-27 DIAGNOSIS — N951 Menopausal and female climacteric states: Secondary | ICD-10-CM | POA: Diagnosis not present

## 2023-03-27 DIAGNOSIS — R232 Flushing: Secondary | ICD-10-CM | POA: Diagnosis not present

## 2023-03-27 DIAGNOSIS — L639 Alopecia areata, unspecified: Secondary | ICD-10-CM | POA: Diagnosis not present

## 2023-05-29 DIAGNOSIS — R109 Unspecified abdominal pain: Secondary | ICD-10-CM | POA: Diagnosis not present

## 2023-05-29 DIAGNOSIS — R300 Dysuria: Secondary | ICD-10-CM | POA: Diagnosis not present

## 2023-05-29 DIAGNOSIS — E042 Nontoxic multinodular goiter: Secondary | ICD-10-CM | POA: Diagnosis not present

## 2023-06-26 DIAGNOSIS — F411 Generalized anxiety disorder: Secondary | ICD-10-CM | POA: Diagnosis not present

## 2023-07-16 ENCOUNTER — Ambulatory Visit
Admission: EM | Admit: 2023-07-16 | Discharge: 2023-07-16 | Disposition: A | Discharge status: Home / Self Care | Payer: 59

## 2023-07-16 ENCOUNTER — Encounter: Payer: Self-pay | Admitting: Emergency Medicine

## 2023-07-16 DIAGNOSIS — R21 Rash and other nonspecific skin eruption: Secondary | ICD-10-CM

## 2023-07-16 DIAGNOSIS — L299 Pruritus, unspecified: Secondary | ICD-10-CM

## 2023-07-16 MED ORDER — PERMETHRIN 5 % EX CREA: TOPICAL_CREAM | CUTANEOUS | 0 refills | Status: AC

## 2023-07-16 NOTE — Discharge Instructions (Addendum)
Please call and schedule a dermatology appt to have skin scraping. Take medication as directed. Follow up with PCP. If new or worsening issues go to ER for further evaluation. Avoid heat hot water as it makes rashes worse. May take otc benadryl in eveing as label directed as it may make you sleepy and allergy med of choice in day to help with itching (zyrtec,claritin,etc).

## 2023-07-16 NOTE — ED Triage Notes (Signed)
Pt presents with a rash that she noticed 3 months ago. She went to her PCP initially but wasn't treated. A few days ago the rash returned and its mainly on her legs and wrist. Pt has tried benadryl, changing her laundry detergent, baking soda, epsom salt and anti itch cream. She developed a headache and diarrhea 2 days ago.

## 2023-07-16 NOTE — ED Provider Notes (Signed)
[facility]    CSN: [id number] Arrival date & time: 07/16/23  6578      History   Chief Complaint Chief Complaint  Patient presents with  . Rash    HPI Brenda George is a 57 y.o. female.   58 year old female, swelling and jet, presents to urgent care for evaluation of rash for the last 3 months.  Rash goes from feet to waist , itchy, patient states she went to her PCP for same but was not treated.  Pt has not been able to get in with dermatologist. Patient states she also developed a headache and diarrhea 2 days ago as well and is concerned that it was related to the rash.  Patient reports she has someone who stays at her house that does not have this rash, however does have a puppy that has had a similar type rash.  Patient states she has been treating her rash with warm bath Epsom salt soaks, over-the-counter itch cream.   The history is provided by the patient. No language interpreter was used.    Past Medical History:  Diagnosis Date  . Anxiety   . Bipolar 1 disorder (HCC)   . Depression   . Hypertension     Patient Active Problem List   Diagnosis Date Noted  . Rash and nonspecific skin eruption 07/16/2023  . Itching 07/16/2023  . Borderline personality disorder (HCC) 11/22/2016  . PTSD (post-traumatic stress disorder) 11/22/2016  . Bipolar I disorder, most recent episode depressed (HCC) 01/19/2016  . Anxiety 11/12/2015  . Essential (primary) hypertension 10/20/2015  . Multinodular goiter 10/20/2015  . Moderate episode of recurrent major depressive disorder (HCC) 04/14/2015  . Headache, migraine 10/22/2013  . Cyst of nasal sinus 05/28/2013  . Cephalalgia 05/21/2013  . 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 B12 deficiency 03/06/2012  . Avitaminosis D 03/06/2012    Past Surgical History:  Procedure Laterality Date  . ABDOMINAL HYSTERECTOMY      OB History   No obstetric history on file.      Home Medications    Prior to Admission  medications   Medication Sig Start Date End Date Taking? Authorizing Provider  ALPRAZolam Prudy Feeler) 1 MG tablet Take 1 mg by mouth 3 (three) times daily as needed for anxiety.   Yes [provider]  pantoprazole (PROTONIX) 20 MG tablet Take 1 tablet (20 mg total) by mouth daily. 12/06/15 07/16/23 Yes Jene Every, MD  permethrin (ELIMITE) 5 % cream Apply topically as label instructed, wash off after 8 hours. 07/16/23  Yes Brix Brearley, Para March, NP  progesterone (PROMETRIUM) 200 MG capsule Take 200 mg by mouth at bedtime. 05/26/23  Yes [provider]  albuterol (PROVENTIL HFA;VENTOLIN HFA) 108 (90 Base) MCG/ACT inhaler Inhale 2 puffs into the lungs every 6 (six) hours as needed for wheezing or shortness of breath. 09/14/15   Hassan Rowan, MD  carvedilol (COREG) 6.25 MG tablet Take 1 tablet by mouth 2 (two) times daily. 04/11/17   [provider]  ciprofloxacin-dexamethasone (CIPRODEX) OTIC suspension Place 4 drops into the right ear 2 (two) times daily. 10/17/19   Verlee Monte, NP  diclofenac (VOLTAREN) 75 MG EC tablet Take 1 tablet (75 mg total) by mouth 2 (two) times daily. 01/28/16   Asencion Islam, DPM  fluconazole (DIFLUCAN) 150 MG tablet Take 1 tablet (150 mg) PO x 1 dose. May repeat 150 mg dose in 3 days if still symptomatic. 10/17/19   Verlee Monte, NP  QUEtiapine (SEROQUEL) 50 MG  tablet Take 50 mg by mouth 3 (three) times daily.  10/17/19  [provider]    Family History Family History  Problem Relation Age of Onset  . Heart disease Mother   . Hypertension Mother   . Heart disease Father   . Hypertension Father     Social History Social History   Tobacco Use  . Smoking status: Former    Current packs/day: 1.00    Types: Cigarettes  . Smokeless tobacco: Never  Vaping Use  . Vaping status: Every Day  Substance Use Topics  . Alcohol use: Yes    Comment: occas.   . Drug use: No     Allergies   Buspirone, Sulfa antibiotics, Nicotine polacrilex,  Citalopram, Fluoxetine, Latuda [lurasidone], and Codeine   Review of Systems Review of Systems  Constitutional:  Negative for fever.  Gastrointestinal:  Positive for diarrhea.  Skin:  Positive for rash.  Neurological:  Positive for headaches.  All other systems reviewed and are negative.    Physical Exam Triage Vital Signs ED Triage Vitals [07/16/23 1031]  Encounter Vitals Group     BP      Systolic BP Percentile      Diastolic BP Percentile      Pulse      Resp      Temp      Temp src      SpO2      Weight      Height      Head Circumference      Peak Flow      Pain Score 4     Pain Loc      Pain Education      Exclude from Growth Chart    No data found.  Updated Vital Signs BP 131/82 (BP Location: Left Arm)   Pulse (!) 58   Temp 98.4 F (36.9 C)   Resp 18   SpO2 98%   Visual Acuity Right Eye Distance:   Left Eye Distance:   Bilateral Distance:    Right Eye Near:   Left Eye Near:    Bilateral Near:     Physical Exam Vitals and nursing note reviewed.  Constitutional:      Appearance: She is well-developed and well-groomed.  HENT:     Head: Normocephalic.  Cardiovascular:     Rate and Rhythm: Normal rate.  Pulmonary:     Effort: Pulmonary effort is normal.  Skin:    Findings: Lesion and rash present.     Comments: Patient has excoriated linear burrow type rash that progresses bilaterally from feet to waist.  None noted on the webbing's of fingers but she does have it on bilateral inner wrists.  Neurological:     General: No focal deficit present.     Mental Status: She is alert and oriented to person, place, and time.     GCS: GCS eye subscore is 4. GCS verbal subscore is 5. GCS motor subscore is 6.     Cranial Nerves: No cranial nerve deficit.     Sensory: No sensory deficit.  Psychiatric:        Mood and Affect: Mood is anxious.        Speech: Speech is rapid and pressured.        Behavior: Behavior is agitated.     UC Treatments /  Results  Labs (all labs ordered are listed, but only abnormal results are displayed) Labs Reviewed - No data to display  EKG  Radiology No results found.  Procedures Procedures (including critical care time)  Medications Ordered in UC Medications - No data to display  Initial Impression / Assessment and Plan / UC Course  I have reviewed the triage vital signs and the nursing notes.  Pertinent labs & imaging results that were available during my care of the patient were reviewed by me and considered in my medical decision making (see chart for details).  Clinical Course as of 07/16/23 1510  Mon Jul 16, 2023  1058 Discussed with patient that this rash quite possibly is scabies, however could be some other type of idiopathic rash, flea bites, etc. most likely the headache and diarrhea are viral in nature and not related to the rash ,pt became irate and requests scabies medication. Pt aware of plan of care, need to follow up with dermatology.  RNs Tiara, present during discussion [JD]    Clinical Course User Index [JD] Adon Gehlhausen, Para March, NP     Ddx: Nonspecific rash,scabies, urticaria, eczema Final Clinical Impressions(s) / UC Diagnoses   Final diagnoses:  Rash and nonspecific skin eruption  Itching     Discharge Instructions      Please call and schedule a dermatology appt to have skin scraping. Take medication as directed. Follow up with PCP. If new or worsening issues go to ER for further evaluation. Avoid heat hot water as it makes rashes worse. May take otc benadryl in eveing as label directed as it may make you sleepy and allergy med of choice in day to help with itching (zyrtec,claritin,etc).     ED Prescriptions     Medication Sig Dispense Auth. Provider   permethrin (ELIMITE) 5 % cream Apply topically as label instructed, wash off after 8 hours. 60 g Maryama Kuriakose, NP      PDMP not reviewed this encounter.   Clancy Gourd, NP 07/16/23  1510

## 2024-01-14 ENCOUNTER — Ambulatory Visit
Admission: EM | Admit: 2024-01-14 | Discharge: 2024-01-14 | Disposition: A | Discharge status: Home / Self Care | Payer: Self-pay | Attending: Emergency Medicine | Admitting: Emergency Medicine

## 2024-01-14 DIAGNOSIS — S00462A Insect bite (nonvenomous) of left ear, initial encounter: Secondary | ICD-10-CM

## 2024-01-14 DIAGNOSIS — W57XXXA Bitten or stung by nonvenomous insect and other nonvenomous arthropods, initial encounter: Secondary | ICD-10-CM

## 2024-01-14 MED ORDER — DOXYCYCLINE HYCLATE 100 MG PO CAPS: 100.0000 mg | ORAL_CAPSULE | Freq: Two times a day (BID) | ORAL | 0 refills | Status: AC

## 2024-01-14 MED ORDER — TRIAMCINOLONE ACETONIDE 0.1 % EX CREA: 1.0000 | TOPICAL_CREAM | Freq: Two times a day (BID) | CUTANEOUS | 0 refills | Status: AC

## 2024-01-14 NOTE — ED Provider Notes (Signed)
 HPI  SUBJECTIVE:  Brenda George is a 58 y.o. female who presents with acute onset of burning pain and itching on the top of her left ear starting yesterday while working outside in the garden.  She did not see or hear an insect.  She reports swelling, erythema which is improving.  She reports having a red welt with a central "white spot" in the area of the pain.  No fevers, drainage.  She tried peroxide, washing it with baking soda and Epsom salt with improvement in her symptoms.  No aggravating factors.  She has a past medical history of hypertension and staph infections.  No history of MRSA.  PCP: [facility]   Past Medical History:  Diagnosis Date   Anxiety    Bipolar 1 disorder (HCC)    Depression    Hypertension     Past Surgical History:  Procedure Laterality Date   ABDOMINAL HYSTERECTOMY      Family History  Problem Relation Age of Onset   Heart disease Mother    Hypertension Mother    Heart disease Father    Hypertension Father     Social History   Tobacco Use   Smoking status: Former    Current packs/day: 1.00    Types: Cigarettes   Smokeless tobacco: Never  Vaping Use   Vaping status: Every Day  Substance Use Topics   Alcohol use: Yes    Comment: occas.    Drug use: No     Current Facility-Administered Medications:    triamcinolone  acetonide (KENALOG ) 10 MG/ML injection 10 mg, 10 mg, Other, Once, Stover, Titorya, DPM  Current Outpatient Medications:    ALPRAZolam (XANAX) 1 MG tablet, Take 1 mg by mouth 3 (three) times daily as needed for anxiety., Disp: , Rfl:    doxycycline (VIBRAMYCIN) 100 MG capsule, Take 1 capsule (100 mg total) by mouth 2 (two) times daily for 7 days., Disp: 14 capsule, Rfl: 0   triamcinolone  cream (KENALOG ) 0.1 %, Apply 1 Application topically 2 (two) times daily. Apply for 2 weeks. May use on face, Disp: 30 g, Rfl: 0   albuterol  (PROVENTIL  HFA;VENTOLIN  HFA) 108 (90 Base) MCG/ACT inhaler, Inhale 2 puffs into the lungs every 6  (six) hours as needed for wheezing or shortness of breath., Disp: 1 Inhaler, Rfl: 1   carvedilol (COREG) 6.25 MG tablet, Take 1 tablet by mouth 2 (two) times daily., Disp: , Rfl:    ciprofloxacin -dexamethasone  (CIPRODEX ) OTIC suspension, Place 4 drops into the right ear 2 (two) times daily., Disp: 7.5 mL, Rfl: 0   diclofenac  (VOLTAREN ) 75 MG EC tablet, Take 1 tablet (75 mg total) by mouth 2 (two) times daily., Disp: 30 tablet, Rfl: 0   fluconazole  (DIFLUCAN ) 150 MG tablet, Take 1 tablet (150 mg) PO x 1 dose. May repeat 150 mg dose in 3 days if still symptomatic., Disp: 2 tablet, Rfl: 0   pantoprazole  (PROTONIX ) 20 MG tablet, Take 1 tablet (20 mg total) by mouth daily., Disp: 30 tablet, Rfl: 1   permethrin  (ELIMITE ) 5 % cream, Apply topically as label instructed, wash off after 8 hours., Disp: 60 g, Rfl: 0   progesterone (PROMETRIUM) 200 MG capsule, Take 200 mg by mouth at bedtime., Disp: , Rfl:   Allergies  Allergen Reactions   Buspirone Nausea Only and Other (See Comments)    Profuse sweating, and vision turned yellow   Sulfa Antibiotics Anaphylaxis and Swelling   Citalopram Hydrobromide Other (See Comments)    hyper  Nicotine Polacrilex Swelling   Citalopram Other (See Comments)    HYPER   Fluoxetine Other (See Comments)    INCREASED GLUCOSE   Latuda [Lurasidone] Other (See Comments)    Hallucinations   Codeine Rash and Hives     ROS  As noted in HPI.   Physical Exam  BP 125/86 (BP Location: Left Arm)   Pulse (!) 52   Temp 98 F (36.7 C) (Oral)   Resp 14   SpO2 97%   Constitutional: Well developed, well nourished, no acute distress Eyes:  EOMI, conjunctiva normal bilaterally HENT: Normocephalic, atraumatic,mucus membranes moist Left ear: Tender White pustule left helix with no surrounding erythema, edema.  No other tenderness along the ear.  No tenderness, swelling along the mastoid.  Rest of the external ear normal.  Lymph: No pre-, postauricular lymphadenopathy.  No  cervical lymphadenopathy. Respiratory: Normal inspiratory effort Cardiovascular: Normal rate GI: nondistended skin: No rash, skin intact Musculoskeletal: no deformities Neurologic: Alert & oriented x 3, no focal neuro deficits Psychiatric: Speech and behavior appropriate   ED Course   Medications - No data to display  No orders of the defined types were placed in this encounter.   No results found for this or any previous visit (from the past 24 hours). No results found.  ED Clinical Impression  1. Insect bite of left ear, initial encounter   2. Bug bite with infection, initial encounter      ED Assessment/Plan     Presentation consistent with insect bite or sting.  Concern for secondary infection.  It does not appear to be a perichondritis at this time.  Will send home with doxycycline for 7 days, return here or follow-up with PCP if not better in 48 hours and we can consider perichondritis and switch her to a fluoroquinolone for 10 days.  Triamcinolone  cream for the itching and burning.  Claritin or Zyrtec for itching.  Discussed labs, imaging, MDM, treatment plan, and plan for follow-up with patient. . patient agrees with plan.   Meds ordered this encounter  Medications   doxycycline (VIBRAMYCIN) 100 MG capsule    Sig: Take 1 capsule (100 mg total) by mouth 2 (two) times daily for 7 days.    Dispense:  14 capsule    Refill:  0   triamcinolone  cream (KENALOG ) 0.1 %    Sig: Apply 1 Application topically 2 (two) times daily. Apply for 2 weeks. May use on face    Dispense:  30 g    Refill:  0      *This clinic note was created using Scientist, clinical (histocompatibility and immunogenetics). Therefore, there may be occasional mistakes despite careful proofreading.  ?    Ethlyn Herd, MD 01/14/24 [phone]

## 2024-01-14 NOTE — Discharge Instructions (Addendum)
 This does not appear to be a perichondritis/cartilage infection at this time.  Take the doxycycline for 7 days, return here or follow-up with PCP if not better in 48 hours and we can switch you to another antibiotic, such as a fluoroquinolone for 10 days.  Triamcinolone  cream for the itching and burning.  Claritin or Zyrtec for itching.

## 2024-01-14 NOTE — ED Triage Notes (Signed)
 Spider bite on left ear.

## 2024-04-15 ENCOUNTER — Other Ambulatory Visit: Payer: Self-pay | Admitting: Family Medicine

## 2024-04-15 DIAGNOSIS — H6504 Acute serous otitis media, recurrent, right ear: Secondary | ICD-10-CM

## 2024-04-15 DIAGNOSIS — R221 Localized swelling, mass and lump, neck: Secondary | ICD-10-CM

## 2024-04-16 ENCOUNTER — Ambulatory Visit
Admission: RE | Admit: 2024-04-16 | Discharge: 2024-04-16 | Disposition: A | Discharge status: Home / Self Care | Payer: Self-pay | Source: Ambulatory Visit | Attending: Family Medicine | Admitting: Family Medicine

## 2024-04-16 DIAGNOSIS — R221 Localized swelling, mass and lump, neck: Secondary | ICD-10-CM | POA: Insufficient documentation

## 2024-04-16 DIAGNOSIS — H6504 Acute serous otitis media, recurrent, right ear: Secondary | ICD-10-CM | POA: Insufficient documentation
